# Patient Record
Sex: Female | Born: 2000 | Race: Black or African American | Hispanic: No | Marital: Single | Smoking: Light tobacco smoker
Health system: Southern US, Community
[De-identification: ages and names within clinical notes are randomized; demographics above are authoritative.]

## PROBLEM LIST (undated history)

## (undated) ENCOUNTER — Inpatient Hospital Stay (HOSPITAL_COMMUNITY): Payer: Self-pay

## (undated) DIAGNOSIS — R569 Unspecified convulsions: Secondary | ICD-10-CM

## (undated) DIAGNOSIS — A749 Chlamydial infection, unspecified: Secondary | ICD-10-CM

## (undated) DIAGNOSIS — F445 Conversion disorder with seizures or convulsions: Secondary | ICD-10-CM

## (undated) HISTORY — PX: NO PAST SURGERIES: SHX2092

## (undated) HISTORY — PX: WISDOM TOOTH EXTRACTION: SHX21

---
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
## 2019-09-14 ENCOUNTER — Emergency Department (HOSPITAL_COMMUNITY)
Admission: EM | Admit: 2019-09-14 | Discharge: 2019-09-14 | Disposition: A | Discharge status: Home / Self Care | Payer: Medicaid Other | Attending: Emergency Medicine | Admitting: Emergency Medicine

## 2019-09-14 ENCOUNTER — Emergency Department (HOSPITAL_COMMUNITY): Payer: Medicaid Other

## 2019-09-14 ENCOUNTER — Other Ambulatory Visit: Payer: Self-pay

## 2019-09-14 ENCOUNTER — Encounter (HOSPITAL_COMMUNITY): Payer: Self-pay | Admitting: Emergency Medicine

## 2019-09-14 DIAGNOSIS — M79644 Pain in right finger(s): Secondary | ICD-10-CM | POA: Insufficient documentation

## 2019-09-14 DIAGNOSIS — M546 Pain in thoracic spine: Secondary | ICD-10-CM | POA: Diagnosis not present

## 2019-09-14 MED ORDER — METHOCARBAMOL 500 MG PO TABS: 500.0000 mg | ORAL_TABLET | Freq: Two times a day (BID) | ORAL | 0 refills | Status: DC

## 2019-09-14 MED ORDER — NAPROXEN 500 MG PO TABS: 500.0000 mg | ORAL_TABLET | Freq: Two times a day (BID) | ORAL | 0 refills | Status: DC

## 2019-09-14 MED ORDER — ACETAMINOPHEN 325 MG PO TABS
650.0000 mg | ORAL_TABLET | Freq: Once | ORAL | Status: AC
  Administered 2019-09-14: 17:00:00 650 mg via ORAL
  Filled 2019-09-14: qty 2

## 2019-09-14 NOTE — ED Notes (Signed)
Pt verbalizes understanding of d/c instructions. Prescriptions reviewed with patient. Pt ambulatory at d/c with all belongings.  
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
## 2019-10-17 ENCOUNTER — Ambulatory Visit (INDEPENDENT_AMBULATORY_CARE_PROVIDER_SITE_OTHER): Payer: Medicaid Other | Admitting: Family Medicine

## 2019-10-17 ENCOUNTER — Other Ambulatory Visit: Payer: Self-pay

## 2019-10-17 ENCOUNTER — Ambulatory Visit: Payer: Medicaid Other | Admitting: Family Medicine

## 2019-10-17 ENCOUNTER — Encounter: Payer: Self-pay | Admitting: Family Medicine

## 2019-10-17 VITALS — BP 110/60 | HR 66 | Ht 64.0 in | Wt 174.8 lb

## 2019-10-17 DIAGNOSIS — Z3200 Encounter for pregnancy test, result unknown: Secondary | ICD-10-CM | POA: Diagnosis not present

## 2019-10-17 DIAGNOSIS — R5383 Other fatigue: Secondary | ICD-10-CM

## 2019-10-17 DIAGNOSIS — R42 Dizziness and giddiness: Secondary | ICD-10-CM

## 2019-10-17 LAB — POCT GLYCOSYLATED HEMOGLOBIN (HGB A1C): Hemoglobin A1C: 5.1 % (ref 4.0–5.6)

## 2019-10-17 LAB — POCT URINE PREGNANCY: Preg Test, Ur: NEGATIVE

## 2019-10-17 NOTE — Progress Notes (Signed)
    SUBJECTIVE:   CHIEF COMPLAINT / HPI:   Dizziness and Nausea Patient presents today due to concern for dizziness and nausea with some associated fatigue. She is concerned that she is pregnant. She describes the episodes as lasting a few seconds and then goes away on its own. It can feel like the room is spinning. It occurs mostly when she goes from sitting to standing. No fevers, chills, vomiting, abdominal pain, last period came at normal time, normal flow, and no pain. She has had no dysuria but has been drinking more fluids lately. She does endorse skipping meals at times.  PERTINENT  PMH / PSH: Hx of fatigue  OBJECTIVE:   BP 110/60   Pulse 66   Ht 5\' 4"  (1.626 m)   Wt 174 lb 12.8 oz (79.3 kg)   LMP 09/24/2019 (Exact Date)   SpO2 97%   Breastfeeding No   BMI 30.00 kg/m   Gen:  NAD CV: RRR, no murmurs, normal S1, S2 split Resp: CTAB, no wheezing, rales, or rhonchi, comfortable work of breathing Ext: no clubbing, cyanosis, or edema Skin: warm, dry, intact, no rashes  ASSESSMENT/PLAN:   Fatigue Unclear etiology. U Preg was negative, orthostatics taken and found to be normal. CBC and BMP obtained to check for anemia or dehydration and both were normal. A1c was normal so unlikely her symptoms are being caused by hyper or hypoglycemic events. TSH also normal so not related to thyroid dysfunction.  - Discussed with patient that she should not skip meals and to make sure she is drinking plenty of water and avoid sugary or overly caffienated beverages. - If symptoms persist or worse perhaps a more focused workup toward vertigo will be more revealing however I did not feel like her symptoms fit well with vertigo. - Overall, patient is pleased she is not pregnant. Follow up as needed.     11/24/2019, DO [place] [facility]

## 2019-10-17 NOTE — Patient Instructions (Signed)
It was great to see you today! Thank you for letting me participate in your care!  Today, we discussed your recent dizziness and found out you are not pregnant. However, I did get some additional blood work to help [place] determine the cause of the dizziness. I will call you if anything is abnormal. If you are still having symptoms after 10 days or it gets worse please return to the clinic. If your results are abnormal, I will bring you in sooner to discuss the next steps.  Be well, Jules Schick, DO PGY-3, [facility]

## 2019-10-18 ENCOUNTER — Encounter: Payer: Self-pay | Admitting: Family Medicine

## 2019-10-18 LAB — CBC
Hematocrit: 41.2 % (ref 34.0–46.6)
Hemoglobin: 13.5 g/dL (ref 11.1–15.9)
MCH: 28.1 pg (ref 26.6–33.0)
MCHC: 32.8 g/dL (ref 31.5–35.7)
MCV: 86 fL (ref 79–97)
Platelets: 331 10*3/uL (ref 150–450)
RBC: 4.81 x10E6/uL (ref 3.77–5.28)
RDW: 12.8 % (ref 11.7–15.4)
WBC: 7.3 10*3/uL (ref 3.4–10.8)

## 2019-10-18 LAB — BASIC METABOLIC PANEL
BUN/Creatinine Ratio: 14 (ref 9–23)
BUN: 12 mg/dL (ref 6–20)
CO2: 21 mmol/L (ref 20–29)
Calcium: 9.7 mg/dL (ref 8.7–10.2)
Chloride: 103 mmol/L (ref 96–106)
Creatinine, Ser: 0.83 mg/dL (ref 0.57–1.00)
GFR calc Af Amer: 117 mL/min/{1.73_m2} (ref >59)
GFR calc non Af Amer: 102 mL/min/{1.73_m2} (ref >59)
Glucose: 78 mg/dL (ref 65–99)
Potassium: 4.7 mmol/L (ref 3.5–5.2)
Sodium: 138 mmol/L (ref 134–144)

## 2019-10-18 LAB — TSH: TSH: 0.827 u[IU]/mL (ref 0.450–4.500)

## 2019-10-18 NOTE — Progress Notes (Signed)
Patient labs normal. Sending via MyChart

## 2019-10-19 NOTE — Assessment & Plan Note (Signed)
Unclear etiology. U Preg was negative, orthostatics taken and found to be normal. CBC and BMP obtained to check for anemia or dehydration and both were normal. A1c was normal so unlikely her symptoms are being caused by hyper or hypoglycemic events. TSH also normal so not related to thyroid dysfunction.  - Discussed with patient that she should not skip meals and to make sure she is drinking plenty of water and avoid sugary or overly caffienated beverages. - If symptoms persist or worse perhaps a more focused workup toward vertigo will be more revealing however I did not feel like her symptoms fit well with vertigo. - Overall, patient is pleased she is not pregnant. Follow up as needed.

## 2020-01-15 NOTE — Progress Notes (Deleted)
    SUBJECTIVE:   CHIEF COMPLAINT / HPI:   Exact LMP Regular** On any BCP ***  PERTINENT  PMH / PSH: ***  OBJECTIVE:   There were no vitals taken for this visit.   General: Alert and oriented, no apparent distress  Eyes: PEERLA ENTM: No pharyengeal erythema Neck: nontender Cardiovascular: RRR with no murmurs noted Respiratory: CTA bilaterally  Gastrointestinal: Bowel sounds present. No abdominal pain MSK: Upper extremity strength 5/5 bilaterally, Lower extremity strength 5/5 bilaterally  Derm: No rashes noted Neuro: ***  Psych: Behavior and speech appropriate to situation  ASSESSMENT/PLAN:   No problem-specific Assessment & Plan notes found for this encounter.     Dana Allan, MD [facility]

## 2020-01-16 ENCOUNTER — Ambulatory Visit: Payer: Medicaid Other

## 2020-01-16 ENCOUNTER — Emergency Department (HOSPITAL_COMMUNITY): Payer: Medicaid Other

## 2020-01-16 ENCOUNTER — Other Ambulatory Visit: Payer: Self-pay

## 2020-01-16 ENCOUNTER — Emergency Department (HOSPITAL_COMMUNITY)
Admission: EM | Admit: 2020-01-16 | Discharge: 2020-01-17 | Disposition: A | Discharge status: Home / Self Care | Payer: Medicaid Other | Attending: Emergency Medicine | Admitting: Emergency Medicine

## 2020-01-16 ENCOUNTER — Inpatient Hospital Stay (HOSPITAL_COMMUNITY)
Admission: AD | Admit: 2020-01-16 | Discharge: 2020-01-16 | Disposition: A | Discharge status: Home / Self Care | Payer: Medicaid Other | Attending: Emergency Medicine | Admitting: Emergency Medicine

## 2020-01-16 ENCOUNTER — Encounter (HOSPITAL_COMMUNITY): Payer: Self-pay | Admitting: Emergency Medicine

## 2020-01-16 ENCOUNTER — Telehealth: Payer: Self-pay

## 2020-01-16 DIAGNOSIS — Z20822 Contact with and (suspected) exposure to covid-19: Secondary | ICD-10-CM | POA: Diagnosis not present

## 2020-01-16 DIAGNOSIS — R509 Fever, unspecified: Secondary | ICD-10-CM | POA: Diagnosis present

## 2020-01-16 DIAGNOSIS — R079 Chest pain, unspecified: Secondary | ICD-10-CM | POA: Insufficient documentation

## 2020-01-16 DIAGNOSIS — R109 Unspecified abdominal pain: Secondary | ICD-10-CM

## 2020-01-16 DIAGNOSIS — R0789 Other chest pain: Secondary | ICD-10-CM | POA: Insufficient documentation

## 2020-01-16 DIAGNOSIS — O26891 Other specified pregnancy related conditions, first trimester: Secondary | ICD-10-CM

## 2020-01-16 DIAGNOSIS — Z3A00 Weeks of gestation of pregnancy not specified: Secondary | ICD-10-CM | POA: Diagnosis not present

## 2020-01-16 DIAGNOSIS — N3000 Acute cystitis without hematuria: Secondary | ICD-10-CM

## 2020-01-16 DIAGNOSIS — O4691 Antepartum hemorrhage, unspecified, first trimester: Secondary | ICD-10-CM | POA: Insufficient documentation

## 2020-01-16 DIAGNOSIS — R05 Cough: Secondary | ICD-10-CM | POA: Diagnosis not present

## 2020-01-16 DIAGNOSIS — R0602 Shortness of breath: Secondary | ICD-10-CM | POA: Diagnosis not present

## 2020-01-16 DIAGNOSIS — N3090 Cystitis, unspecified without hematuria: Secondary | ICD-10-CM | POA: Insufficient documentation

## 2020-01-16 DIAGNOSIS — R0981 Nasal congestion: Secondary | ICD-10-CM | POA: Diagnosis not present

## 2020-01-16 DIAGNOSIS — R1031 Right lower quadrant pain: Secondary | ICD-10-CM | POA: Insufficient documentation

## 2020-01-16 DIAGNOSIS — O4690 Antepartum hemorrhage, unspecified, unspecified trimester: Secondary | ICD-10-CM

## 2020-01-16 LAB — URINALYSIS, ROUTINE W REFLEX MICROSCOPIC
Bilirubin Urine: NEGATIVE
Glucose, UA: NEGATIVE mg/dL
Ketones, ur: 80 mg/dL — AB
Nitrite: NEGATIVE
Protein, ur: 100 mg/dL — AB
Specific Gravity, Urine: 1.032 — ABNORMAL HIGH (ref 1.005–1.030)
WBC, UA: >50 WBC/hpf — ABNORMAL HIGH (ref 0–5)
pH: 6 (ref 5.0–8.0)

## 2020-01-16 LAB — LIPASE, BLOOD: Lipase: 31 U/L (ref 11–51)

## 2020-01-16 LAB — WET PREP, GENITAL
Sperm: NONE SEEN
Trich, Wet Prep: NONE SEEN
Yeast Wet Prep HPF POC: NONE SEEN

## 2020-01-16 LAB — CBC
HCT: 41.4 % (ref 36.0–46.0)
Hemoglobin: 13.3 g/dL (ref 12.0–15.0)
MCH: 27.5 pg (ref 26.0–34.0)
MCHC: 32.1 g/dL (ref 30.0–36.0)
MCV: 85.7 fL (ref 80.0–100.0)
Platelets: 292 10*3/uL (ref 150–400)
RBC: 4.83 MIL/uL (ref 3.87–5.11)
RDW: 12.5 % (ref 11.5–15.5)
WBC: 4.4 10*3/uL (ref 4.0–10.5)
nRBC: 0 % (ref 0.0–0.2)

## 2020-01-16 LAB — COMPREHENSIVE METABOLIC PANEL
ALT: 17 U/L (ref 0–44)
AST: 18 U/L (ref 15–41)
Albumin: 3.9 g/dL (ref 3.5–5.0)
Alkaline Phosphatase: 66 U/L (ref 38–126)
Anion gap: 10 (ref 5–15)
BUN: 9 mg/dL (ref 6–20)
CO2: 20 mmol/L — ABNORMAL LOW (ref 22–32)
Calcium: 9.2 mg/dL (ref 8.9–10.3)
Chloride: 105 mmol/L (ref 98–111)
Creatinine, Ser: 0.73 mg/dL (ref 0.44–1.00)
GFR calc Af Amer: >60 mL/min (ref >60)
GFR calc non Af Amer: >60 mL/min (ref >60)
Glucose, Bld: 90 mg/dL (ref 70–99)
Potassium: 3.4 mmol/L — ABNORMAL LOW (ref 3.5–5.1)
Sodium: 135 mmol/L (ref 135–145)
Total Bilirubin: 0.7 mg/dL (ref 0.3–1.2)
Total Protein: 7.7 g/dL (ref 6.5–8.1)

## 2020-01-16 LAB — TROPONIN I (HIGH SENSITIVITY)
Troponin I (High Sensitivity): <2 ng/L (ref <18)
Troponin I (High Sensitivity): <2 ng/L (ref <18)

## 2020-01-16 LAB — SARS CORONAVIRUS 2 BY RT PCR (HOSPITAL ORDER, PERFORMED IN ~~LOC~~ HOSPITAL LAB): SARS Coronavirus 2: NEGATIVE

## 2020-01-16 LAB — I-STAT BETA HCG BLOOD, ED (MC, WL, AP ONLY): I-stat hCG, quantitative: >2000 m[IU]/mL — ABNORMAL HIGH (ref <5)

## 2020-01-16 LAB — HCG, QUANTITATIVE, PREGNANCY: hCG, Beta Chain, Quant, S: 9008 m[IU]/mL — ABNORMAL HIGH (ref <5)

## 2020-01-16 MED ORDER — SODIUM CHLORIDE 0.9 % IV BOLUS
1000.0000 mL | Freq: Once | INTRAVENOUS | Status: AC
  Administered 2020-01-16: 1000 mL via INTRAVENOUS

## 2020-01-16 MED ORDER — SODIUM CHLORIDE 0.9 % IV SOLN
2.0000 g | Freq: Once | INTRAVENOUS | Status: AC
  Administered 2020-01-16: 2 g via INTRAVENOUS
  Filled 2020-01-16: qty 20

## 2020-01-16 MED ORDER — ONDANSETRON HCL 4 MG/2ML IJ SOLN
4.0000 mg | Freq: Once | INTRAMUSCULAR | Status: AC
  Administered 2020-01-16: 4 mg via INTRAVENOUS
  Filled 2020-01-16: qty 2

## 2020-01-16 NOTE — ED Provider Notes (Signed)
Assumed care from PA Layden at shift change.  See prior notes for full H&P.  Briefly, 19 y.o. F who is newly pregnant here with urinary symptoms, nausea, vomiting, diarrhea, and reported fevers.  UA infectious.  Has spoken with Dr. Despina Hidden already-- requested covid screen, [place], 2g Rocephin for UTI.  Consult him back with results.  Plan:  covid screen and [place] pending.  IV Rocephin has been given.  Results for orders placed or performed during the hospital encounter of 01/16/20  SARS Coronavirus 2 by RT PCR (hospital order, performed in [facility] hospital lab) Nasopharyngeal Nasopharyngeal Swab   Specimen: Nasopharyngeal Swab  Result Value Ref Range   SARS Coronavirus 2 NEGATIVE NEGATIVE  Wet prep, genital  Result Value Ref Range   Yeast Wet Prep HPF POC NONE SEEN NONE SEEN   Trich, Wet Prep NONE SEEN NONE SEEN   Clue Cells Wet Prep HPF POC PRESENT (A) NONE SEEN   WBC, Wet Prep HPF POC MODERATE (A) NONE SEEN   Sperm NONE SEEN   Lipase, blood  Result Value Ref Range   Lipase 31 11 - 51 U/L  Comprehensive metabolic panel  Result Value Ref Range   Sodium 135 135 - 145 mmol/L   Potassium 3.4 (L) 3.5 - 5.1 mmol/L   Chloride 105 98 - 111 mmol/L   CO2 20 (L) 22 - 32 mmol/L   Glucose, Bld 90 70 - 99 mg/dL   BUN 9 6 - 20 mg/dL   Creatinine, Ser 0.25 0.44 - 1.00 mg/dL   Calcium 9.2 8.9 - 85.2 mg/dL   Total Protein 7.7 6.5 - 8.1 g/dL   Albumin 3.9 3.5 - 5.0 g/dL   AST 18 15 - 41 U/L   ALT 17 0 - 44 U/L   Alkaline Phosphatase 66 38 - 126 U/L   Total Bilirubin 0.7 0.3 - 1.2 mg/dL   GFR calc non Af Amer >60 >60 mL/min   GFR calc Af Amer >60 >60 mL/min   Anion gap 10 5 - 15  CBC  Result Value Ref Range   WBC 4.4 4.0 - 10.5 K/uL   RBC 4.83 3.87 - 5.11 MIL/uL   Hemoglobin 13.3 12.0 - 15.0 g/dL   HCT 77.8 36 - 46 %   MCV 85.7 80.0 - 100.0 fL   MCH 27.5 26.0 - 34.0 pg   MCHC 32.1 30.0 - 36.0 g/dL   RDW 24.2 35.3 - 61.4 %   Platelets 292 150 - 400 K/uL   nRBC 0.0 0.0 - 0.2 %  Urinalysis,  Routine w reflex microscopic Urine, Clean Catch  Result Value Ref Range   Color, Urine AMBER (A) YELLOW   APPearance CLOUDY (A) CLEAR   Specific Gravity, Urine 1.032 (H) 1.005 - 1.030   pH 6.0 5.0 - 8.0   Glucose, UA NEGATIVE NEGATIVE mg/dL   Hgb urine dipstick MODERATE (A) NEGATIVE   Bilirubin Urine NEGATIVE NEGATIVE   Ketones, ur 80 (A) NEGATIVE mg/dL   Protein, ur 431 (A) NEGATIVE mg/dL   Nitrite NEGATIVE NEGATIVE   Leukocytes,Ua LARGE (A) NEGATIVE   RBC / HPF 21-50 0 - 5 RBC/hpf   WBC, UA >50 (H) 0 - 5 WBC/hpf   Bacteria, UA FEW (A) NONE SEEN   Squamous Epithelial / LPF 11-20 0 - 5   Mucus PRESENT    Non Squamous Epithelial 0-5 (A) NONE SEEN  hCG, quantitative, pregnancy  Result Value Ref Range   hCG, Beta Chain, Quant, S 9,008 (H) <5  mIU/mL  I-Stat beta hCG blood, ED  Result Value Ref Range   I-stat hCG, quantitative >2,000.0 (H) <5 mIU/mL   Comment 3          ABO/Rh  Result Value Ref Range   ABO/RH(D) B POS    No rh immune globuloin      NOT A RH IMMUNE GLOBULIN CANDIDATE, PT RH POSITIVE Performed at [facility], [address], [place], [place] [address]   Troponin I (High Sensitivity)  Result Value Ref Range   Troponin I (High Sensitivity) <2 <18 ng/L  Troponin I (High Sensitivity)  Result Value Ref Range   Troponin I (High Sensitivity) <2 <18 ng/L   DG Chest 1 View  Result Date: 01/16/2020 CLINICAL DATA:  Chest pain EXAM: CHEST  1 VIEW COMPARISON:  None. FINDINGS: The heart size and mediastinal contours are within normal limits. Both lungs are clear. The visualized skeletal structures are unremarkable. IMPRESSION: No active disease. Electronically Signed   By: Paulina Fusi M.D.   On: 01/16/2020 21:48   US OB Comp < 14 Wks  Result Date: 01/16/2020 CLINICAL DATA:  Vaginal bleeding. EXAM: OBSTETRIC <14 WK ULTRASOUND TECHNIQUE: Transabdominal ultrasound was performed for evaluation of the gestation as well as the maternal uterus and adnexal regions. COMPARISON:   None. FINDINGS: Intrauterine gestational sac: Single Yolk sac:  Not Visualized. Embryo:  Not Visualized. Cardiac Activity: Not Visualized. Heart Rate: N/A bpm MSD:  8 mm   5 w   4 d Subchorionic hemorrhage:  A small subchorionic hemorrhage is noted. Maternal uterus/adnexae: Both ovaries are visualized and are normal in appearance. A moderate amount of pelvic free fluid is seen. IMPRESSION: Single intrauterine gestational sac (at approximately 5 weeks and 4 days gestation by ultrasound evaluation) without evidence of a yolk sac or fetal pole. While this may be secondary to early intrauterine pregnancy, correlation with follow-up pelvic ultrasound is recommended. Electronically Signed   By: Aram Candela M.D.   On: 01/16/2020 23:51     11:57 PM Spoke with Dr. Despina Hidden-- recommends to d/c home with keflex TID x10 days, FU in clinic in 10 days for repeat US.  12:05 AM Patient reassessed and--- feeling better after zofran.  IV abx and IVF infusing.  She has been updated with results thus far. Will PO challenge and likely discharge after infusions are complete.  2:09 AM Attempting ginger ale and crackers right now.  Will reassess.  2:50 AM Tolerating fluids well, no vomiting.  Stable for discharge home.  Will follow-up with [facility] in 10 days for repeat US.  Rx keflex as above, good oral hydration.  Return here for any new/acute changes.   Garlon Hatchet, PA-C 01/17/20 0253    Marily Memos, MD 01/17/20 [phone]

## 2020-01-16 NOTE — ED Notes (Signed)
Patient currently at ultrasound .  

## 2020-01-16 NOTE — ED Triage Notes (Signed)
Pt reports fever, n/v/d runny nose and cough for the past few days. Son was recently sick as well but did not see a doctor, also reports positive at home pregnancy test and endorses light vaginal spotting.

## 2020-01-16 NOTE — ED Provider Notes (Signed)
[facility] EMERGENCY DEPARTMENT Provider Note   CSN: [id number] Arrival date & time: 01/16/20  1033     History Chief Complaint  Patient presents with  . Fever  . Emesis  . Diarrhea    Kristin Hunt is a 19 y.o. female with PMH/o pseudoseizures who presents for evaluation of multiple complaints.  Patient reports that she was initially going to the family medicine clinic today for confirmation of pregnancy.  She reports that about 6 days ago, she took a at home pregnancy test that was positive.  She states that she was going to family clinic this morning to get confirmation but states that she would had been having a few days of cough, nasal congestion, subjective fever/chills, fatigue.  They sent her to the emergency department since that she was having other respiratory symptoms.  She said for the last 3 days, she has had subjective fever chills.  She states she has had cough that is productive of phlegm.  She has had some nasal congestion.  She also reports she has had some nausea/vomiting.  She also reports that over the last few days, she has had some lower abdominal pain.  She describes it as a constant cramping type pain.  She also noticed about 2 to 3 days ago, every time she wiped after urination, she saw blood on the toilet paper.  She states that today she felt like the bleeding was getting little bit worse and was happening even when she was not urinating.  She states that she is only used 1 pad today.  While in the waiting room, she states that she started developing some chest pain, difficulty breathing.  She states that she did not get Covid vaccinated.  She has had some exposure to COVID-19 at work.  She has previous pregnancy with no prior complications.  She has not sought OB/GYN evaluation for this pregnancy yet.  The history is provided by the patient.       Past Medical History:  Diagnosis Date  . Chlamydia   . Pseudoseizures   . Pseudoseizures      Patient Active Problem List   Diagnosis Date Noted  . Fatigue 10/17/2019  . Dizziness 10/17/2019  . Possible pregnancy 03/27/2018  . Pseudoseizure   . Body mass index (BMI) of 85th to less than 95th percentile in overweight pediatric patient 04/08/2017  . Foster care (status) 08/09/2016    Past Surgical History:  Procedure Laterality Date  . NO PAST SURGERIES    . WISDOM TOOTH EXTRACTION     2018     OB History    Gravida  1   Para  1   Term  1   Preterm  0   AB  0   Living  1     SAB  0   TAB  0   Ectopic  0   Multiple  0   Live Births  1           Family History  Problem Relation Age of Onset  . Diabetes Paternal Grandmother   . Lung cancer Paternal Grandmother     Social History   Tobacco Use  . Smoking status: Never Smoker  . Smokeless tobacco: Never Used  Substance Use Topics  . Alcohol use: No  . Drug use: No    Home Medications Prior to Admission medications   Medication Sig Start Date End Date Taking? Authorizing Provider  ibuprofen (ADVIL,MOTRIN) 600 MG tablet Take 1  tablet (600 mg total) by mouth every 6 (six) hours. Patient not taking: Reported on 01/16/2020 01/06/18   Cresenzo-Dishmon, Scarlette CalicoFrances, CNM  methocarbamol (ROBAXIN) 500 MG tablet Take 1 tablet (500 mg total) by mouth 2 (two) times daily. Patient not taking: Reported on 01/16/2020 09/14/19   Henderly, Britni A, PA-C  naproxen (NAPROSYN) 500 MG tablet Take 1 tablet (500 mg total) by mouth 2 (two) times daily. Patient not taking: Reported on 01/16/2020 09/14/19   Henderly, Britni A, PA-C    Allergies    Patient has no known allergies.  Review of Systems   Review of Systems  Constitutional: Positive for fever.  HENT: Positive for congestion.   Respiratory: Positive for cough and shortness of breath.   Cardiovascular: Positive for chest pain.  Gastrointestinal: Positive for abdominal pain. Negative for nausea and vomiting.  Genitourinary: Positive for vaginal bleeding.  Negative for dysuria and hematuria.  Neurological: Negative for headaches.  All other systems reviewed and are negative.   Physical Exam Updated Vital Signs BP 94/74   Pulse 94   Temp 99 F (37.2 C) (Oral)   Resp 14   SpO2 100%   Physical Exam Vitals and nursing note reviewed. Exam conducted with a chaperone present.  Constitutional:      Appearance: Normal appearance. She is well-developed.     Comments: Appears uncomfortable but NAD  HENT:     Head: Normocephalic and atraumatic.  Eyes:     General: Lids are normal.     Conjunctiva/sclera: Conjunctivae normal.     Pupils: Pupils are equal, round, and reactive to light.  Cardiovascular:     Rate and Rhythm: Normal rate and regular rhythm.     Pulses: Normal pulses.     Heart sounds: Normal heart sounds. No murmur heard.  No friction rub. No gallop.   Pulmonary:     Effort: Pulmonary effort is normal.     Breath sounds: Normal breath sounds.     Comments: Lungs clear to auscultation bilaterally.  Symmetric chest rise.  No wheezing, rales, rhonchi. Abdominal:     Palpations: Abdomen is soft. Abdomen is not rigid.     Tenderness: There is abdominal tenderness in the right lower quadrant and suprapubic area. There is no right CVA tenderness, left CVA tenderness or guarding.     Comments: Abdomen is soft, nondistended.  Tenderness palpation of suprapubic region.  No rigidity, guarding.  She also has some mild tenderness in the right lower quadrant.  No rigidity, guarding. No CVA tenderness noted bilaterally.   Genitourinary:    Comments: The exam was performed with a chaperone present. Normal external female genitalia. No lesions, rash, or sores.  Bleeding noted in vaginal vault that appears to be coming from cervix.  Limited evaluation of the cervical os secondary to bleeding.  She also has a moderate amount of discharge noted.  No CMT but does have some right adnexal tenderness.  No mass palpated.  No left adnexal mass or  tenderness noted. Musculoskeletal:        General: Normal range of motion.     Cervical back: Full passive range of motion without pain.  Skin:    General: Skin is warm and dry.     Capillary Refill: Capillary refill takes less than 2 seconds.  Neurological:     Mental Status: She is alert and oriented to person, place, and time.  Psychiatric:        Speech: Speech normal.     ED Results /  Procedures / Treatments   Labs (all labs ordered are listed, but only abnormal results are displayed) Labs Reviewed  WET PREP, GENITAL - Abnormal; Notable for the following components:      Result Value   Clue Cells Wet Prep HPF POC PRESENT (*)    WBC, Wet Prep HPF POC MODERATE (*)    All other components within normal limits  COMPREHENSIVE METABOLIC PANEL - Abnormal; Notable for the following components:   Potassium 3.4 (*)    CO2 20 (*)    All other components within normal limits  URINALYSIS, ROUTINE W REFLEX MICROSCOPIC - Abnormal; Notable for the following components:   Color, Urine AMBER (*)    APPearance CLOUDY (*)    Specific Gravity, Urine 1.032 (*)    Hgb urine dipstick MODERATE (*)    Ketones, ur 80 (*)    Protein, ur 100 (*)    Leukocytes,Ua LARGE (*)    WBC, UA >50 (*)    Bacteria, UA FEW (*)    Non Squamous Epithelial 0-5 (*)    All other components within normal limits  HCG, QUANTITATIVE, PREGNANCY - Abnormal; Notable for the following components:   hCG, Beta Chain, Quant, S 9,008 (*)    All other components within normal limits  I-STAT BETA HCG BLOOD, ED ([facility], [facility], [facility] ONLY) - Abnormal; Notable for the following components:   I-stat hCG, quantitative >2,000.0 (*)    All other components within normal limits  SARS CORONAVIRUS 2 BY RT PCR (HOSPITAL ORDER, PERFORMED IN [facility])  URINE CULTURE  LIPASE, BLOOD  CBC  ABO/RH  GC/CHLAMYDIA PROBE AMP ([place]) NOT AT [facility]  TROPONIN I (HIGH SENSITIVITY)  TROPONIN I (HIGH SENSITIVITY)     EKG None  Radiology DG Chest 1 View  Result Date: 01/16/2020 CLINICAL DATA:  Chest pain EXAM: CHEST  1 VIEW COMPARISON:  None. FINDINGS: The heart size and mediastinal contours are within normal limits. Both lungs are clear. The visualized skeletal structures are unremarkable. IMPRESSION: No active disease. Electronically Signed   By: Paulina Fusi M.D.   On: 01/16/2020 21:48    Procedures Procedures (including critical care time)  Medications Ordered in ED Medications  cefTRIAXone (ROCEPHIN) 2 g in sodium chloride 0.9 % 100 mL IVPB (has no administration in time range)  sodium chloride 0.9 % bolus 1,000 mL (has no administration in time range)  ondansetron (ZOFRAN) injection 4 mg (4 mg Intravenous Given 01/16/20 2159)    ED Course  I have reviewed the triage vital signs and the nursing notes.  Pertinent labs & imaging results that were available during my care of the patient were reviewed by me and considered in my medical decision making (see chart for details).    MDM Rules/Calculators/A&P                           19 year old female presents for evaluation of pregnancy, abdominal pain, vaginal bleeding as well as fever, cough, congestion as well as chest pain and shortness of breath.  She had a previous at home pregnancy test that was positive and was going to follow-up this morning but was told to come to emergency department because she has had some fever, cough, chills.  She also reports she has had some chest pain and difficulty breathing that started while she was out in the waiting room.  She is G1, P1.  She states her LMP was at the beginning of  July.  On initial arrival, she is low grade fever.  She appears uncomfortable but nontoxic.  On exam, she does have some suprapubic and right lower quadrant abdominal pain.  Mostly on exam, her tenderness is in the suprapubic region.  The right lower quadrant abdominal pain is very minimal.  At this time, she has no focal tenderness  noted at McBurney's point.  I do not suspect that this is appendicitis given that she has had cough, congestion as well.  Lungs clear to auscultation.  Concern for infectious etiology such as COVID-19.  Also consider viral URI.  Also consider ectopic pregnancy versus miscarriage.  Labs ordered at triage.  I-STAT beta is positive greater than 2000.  CMP shows potassium of 3.4.  Normal BUN and creatinine.  Lipase normal.  CBC shows no leukocytosis or anemia.  UA is concerning for infectious etiology with leukocytes, pyuria, bacteria.  Kristin Hunt is negative.  Chest x-ray negative for any infectious etiology.  Pelvic exam as documented above.  Patient with bleeding in vaginal vault that appears to be coming from cervix.  Limited evaluation of the cervical os.  Patient also with large amount of discharge noted.  No CMT.  She does have some right adnexal tenderness.  No mass.  No left adnexal tenderness.  I discussed with Dr. Despina Hidden (OB/GYN).  He recommends obtaining Covid, beta quant.  Recommends ultrasound and can be consulted after Covid and quant are back.  He recommends giving 2 g of IV Rocephin for UTI.  Patient signed out to Sharilyn Sites, PA-C pending U/S Covid test and re-evaluation.   Kristin Hunt was evaluated in Emergency Department on 01/16/2020 for the symptoms described in the history of present illness. She was evaluated in the context of the global COVID-19 pandemic, which necessitated consideration that the patient might be at risk for infection with the SARS-CoV-2 virus that causes COVID-19. Institutional protocols and algorithms that pertain to the evaluation of patients at risk for COVID-19 are in a state of rapid change based on information released by regulatory bodies including the CDC and federal and state organizations. These policies and algorithms were followed during the patient's care in the ED.  Portions of this note were generated with Scientist, clinical (histocompatibility and immunogenetics). Dictation errors may  occur despite best attempts at proofreading.   Final Clinical Impression(s) / ED Diagnoses Final diagnoses:  Abdominal pain during pregnancy in first trimester  Vaginal bleeding in pregnancy  Acute cystitis without hematuria    Rx / DC Orders ED Discharge Orders    None       Rosana Hoes 01/16/20 2339    Milagros Loll, MD 01/17/20 0004

## 2020-01-16 NOTE — ED Notes (Addendum)
Pt brought to this RN from waiting room. Pt c/o SOB and CP w/ breathing. Pt reports she has felt SOB since being here but CP just began. Vitals reassessed. New orders placed based on new complaints

## 2020-01-16 NOTE — Telephone Encounter (Signed)
Noted and agreed thank you 

## 2020-01-16 NOTE — Telephone Encounter (Signed)
Patient reports to clinic to confirm pregnancy. At check-in, patient reports cough, fever, chills, headache, weakness and runny nose. Patient was advised by front desk staff to return to car for further instruction. RN calls patient at provided number, verified with name and DOB. Dr. Clent Ridges present during conversation. Patient reports positive home pregnancy test. Patient reports abdominal pain starting two days ago and vaginal bleeding. Due to these symptoms, patient was advised by Dr. Clent Ridges and Dr. Manson Passey (preceptor) to be evaluated in [facility].   Patient was agreeable to plan and will report to [facility]U for evaluation.   Veronda Prude, RN

## 2020-01-17 LAB — ABO/RH: ABO/RH(D): B POS

## 2020-01-17 MED ORDER — CEPHALEXIN 500 MG PO CAPS
500.0000 mg | ORAL_CAPSULE | Freq: Three times a day (TID) | ORAL | 0 refills | Status: DC
Start: 2020-01-17 — End: 2020-02-21

## 2020-01-17 NOTE — Discharge Instructions (Signed)
Take the prescribed medication as directed.  Make sure to finish all of the medication. Try to make sure to stay hydrated. Follow-up with women's clinic in 10 days for repeat ultrasound. Return to the ED for new or worsening symptoms.

## 2020-01-18 LAB — GC/CHLAMYDIA PROBE AMP (~~LOC~~) NOT AT ARMC
Chlamydia: NEGATIVE
Comment: NEGATIVE
Comment: NORMAL
Neisseria Gonorrhea: POSITIVE — AB

## 2020-02-21 ENCOUNTER — Encounter (HOSPITAL_COMMUNITY): Payer: Self-pay | Admitting: Obstetrics & Gynecology

## 2020-02-21 ENCOUNTER — Inpatient Hospital Stay (HOSPITAL_COMMUNITY): Payer: Medicaid Other

## 2020-02-21 ENCOUNTER — Other Ambulatory Visit: Payer: Self-pay

## 2020-02-21 ENCOUNTER — Inpatient Hospital Stay (HOSPITAL_COMMUNITY)
Admission: AD | Admit: 2020-02-21 | Discharge: 2020-02-21 | Disposition: A | Discharge status: Home / Self Care | Payer: Medicaid Other | Attending: Obstetrics & Gynecology | Admitting: Obstetrics & Gynecology

## 2020-02-21 DIAGNOSIS — Z79899 Other long term (current) drug therapy: Secondary | ICD-10-CM | POA: Diagnosis not present

## 2020-02-21 DIAGNOSIS — R109 Unspecified abdominal pain: Secondary | ICD-10-CM | POA: Diagnosis not present

## 2020-02-21 DIAGNOSIS — Z3A09 9 weeks gestation of pregnancy: Secondary | ICD-10-CM | POA: Diagnosis not present

## 2020-02-21 DIAGNOSIS — O26891 Other specified pregnancy related conditions, first trimester: Secondary | ICD-10-CM | POA: Insufficient documentation

## 2020-02-21 DIAGNOSIS — R1013 Epigastric pain: Secondary | ICD-10-CM | POA: Diagnosis not present

## 2020-02-21 DIAGNOSIS — Z87440 Personal history of urinary (tract) infections: Secondary | ICD-10-CM | POA: Diagnosis not present

## 2020-02-21 DIAGNOSIS — O219 Vomiting of pregnancy, unspecified: Secondary | ICD-10-CM | POA: Diagnosis not present

## 2020-02-21 DIAGNOSIS — Z791 Long term (current) use of non-steroidal anti-inflammatories (NSAID): Secondary | ICD-10-CM | POA: Insufficient documentation

## 2020-02-21 LAB — COMPREHENSIVE METABOLIC PANEL
ALT: 26 U/L (ref 0–44)
AST: 25 U/L (ref 15–41)
Albumin: 3.8 g/dL (ref 3.5–5.0)
Alkaline Phosphatase: 48 U/L (ref 38–126)
Anion gap: 16 — ABNORMAL HIGH (ref 5–15)
BUN: 7 mg/dL (ref 6–20)
CO2: 15 mmol/L — ABNORMAL LOW (ref 22–32)
Calcium: 9.8 mg/dL (ref 8.9–10.3)
Chloride: 103 mmol/L (ref 98–111)
Creatinine, Ser: 0.76 mg/dL (ref 0.44–1.00)
GFR calc Af Amer: >60 mL/min (ref >60)
GFR calc non Af Amer: >60 mL/min (ref >60)
Glucose, Bld: 83 mg/dL (ref 70–99)
Potassium: 3 mmol/L — ABNORMAL LOW (ref 3.5–5.1)
Sodium: 134 mmol/L — ABNORMAL LOW (ref 135–145)
Total Bilirubin: 0.7 mg/dL (ref 0.3–1.2)
Total Protein: 7.7 g/dL (ref 6.5–8.1)

## 2020-02-21 LAB — CBC WITH DIFFERENTIAL/PLATELET
Abs Immature Granulocytes: 0.01 10*3/uL (ref 0.00–0.07)
Basophils Absolute: 0 10*3/uL (ref 0.0–0.1)
Basophils Relative: 0 %
Eosinophils Absolute: 0 10*3/uL (ref 0.0–0.5)
Eosinophils Relative: 0 %
HCT: 41.2 % (ref 36.0–46.0)
Hemoglobin: 13.9 g/dL (ref 12.0–15.0)
Immature Granulocytes: 0 %
Lymphocytes Relative: 19 %
Lymphs Abs: 0.9 10*3/uL (ref 0.7–4.0)
MCH: 27.9 pg (ref 26.0–34.0)
MCHC: 33.7 g/dL (ref 30.0–36.0)
MCV: 82.6 fL (ref 80.0–100.0)
Monocytes Absolute: 0.6 10*3/uL (ref 0.1–1.0)
Monocytes Relative: 12 %
Neutro Abs: 3.3 10*3/uL (ref 1.7–7.7)
Neutrophils Relative %: 69 %
Platelets: 283 10*3/uL (ref 150–400)
RBC: 4.99 MIL/uL (ref 3.87–5.11)
RDW: 12.6 % (ref 11.5–15.5)
WBC: 4.7 10*3/uL (ref 4.0–10.5)
nRBC: 0 % (ref 0.0–0.2)

## 2020-02-21 LAB — URINALYSIS, ROUTINE W REFLEX MICROSCOPIC
Bilirubin Urine: NEGATIVE
Glucose, UA: NEGATIVE mg/dL
Hgb urine dipstick: NEGATIVE
Ketones, ur: 80 mg/dL — AB
Nitrite: NEGATIVE
Protein, ur: 100 mg/dL — AB
Specific Gravity, Urine: 1.032 — ABNORMAL HIGH (ref 1.005–1.030)
WBC, UA: >50 WBC/hpf — ABNORMAL HIGH (ref 0–5)
pH: 6 (ref 5.0–8.0)

## 2020-02-21 LAB — LIPASE, BLOOD: Lipase: 48 U/L (ref 11–51)

## 2020-02-21 MED ORDER — LACTATED RINGERS IV SOLN: INTRAVENOUS | Status: DC

## 2020-02-21 MED ORDER — FENTANYL CITRATE (PF) 100 MCG/2ML IJ SOLN
50.0000 ug | Freq: Once | INTRAMUSCULAR | Status: AC
  Administered 2020-02-21: 50 ug via INTRAVENOUS
  Filled 2020-02-21: qty 2

## 2020-02-21 MED ORDER — ONDANSETRON 4 MG PO TBDP: 4.0000 mg | ORAL_TABLET | Freq: Three times a day (TID) | ORAL | 2 refills | Status: DC | PRN

## 2020-02-21 MED ORDER — FAMOTIDINE IN NACL 20-0.9 MG/50ML-% IV SOLN
20.0000 mg | Freq: Once | INTRAVENOUS | Status: AC
  Administered 2020-02-21: 20 mg via INTRAVENOUS
  Filled 2020-02-21: qty 50

## 2020-02-21 MED ORDER — ONDANSETRON HCL 4 MG/2ML IJ SOLN
4.0000 mg | Freq: Once | INTRAMUSCULAR | Status: AC
  Administered 2020-02-21: 4 mg via INTRAVENOUS
  Filled 2020-02-21: qty 2

## 2020-02-21 MED ORDER — LACTATED RINGERS IV BOLUS
1000.0000 mL | Freq: Once | INTRAVENOUS | Status: AC
  Administered 2020-02-21: 1000 mL via INTRAVENOUS

## 2020-02-21 NOTE — MAU Provider Note (Signed)
History     CSN: [id number]  Arrival date and time: 02/21/20 0254   First Provider Initiated Contact with Patient 02/21/20 0109      Chief Complaint  Patient presents with  . Emesis During Pregnancy  . Nausea  . Abdominal Pain   Kristin Hunt is a 19 y.o. G2P1 at [redacted]w[redacted]d who presents to MAU with complaints of abdominal pain and emesis. Patient reports symptoms started occurring 3 days ago. Patient reports sharp stabbing pain that is constant in RLQ. Rates pain 10/10- has not taken any medication for pain. Patient reports that nausea and emesis started around the same time as the abdominal pain. She reports that she has been unable to eat or keep anything down. She denies vaginal bleeding or vaginal discharge. Intrauterine gestational sac was seen on [place] on 8/4- no concern for ectopic pregnancy at this time.    OB History    Gravida  2   Para  1   Term  1   Preterm  0   AB  0   Living  1     SAB  0   TAB  0   Ectopic  0   Multiple  0   Live Births  1           Past Medical History:  Diagnosis Date  . Chlamydia   . Pseudoseizures   . Pseudoseizures     Past Surgical History:  Procedure Laterality Date  . NO PAST SURGERIES    . WISDOM TOOTH EXTRACTION     2018    Family History  Problem Relation Age of Onset  . Diabetes Paternal Grandmother   . Lung cancer Paternal Grandmother     Social History   Tobacco Use  . Smoking status: Never Smoker  . Smokeless tobacco: Never Used  Substance Use Topics  . Alcohol use: No  . Drug use: No    Allergies: No Known Allergies  Medications Prior to Admission  Medication Sig Dispense Refill Last Dose  . cephALEXin (KEFLEX) 500 MG capsule Take 1 capsule (500 mg total) by mouth 3 (three) times daily. 21 capsule 0   . ibuprofen (ADVIL,MOTRIN) 600 MG tablet Take 1 tablet (600 mg total) by mouth every 6 (six) hours. (Patient not taking: Reported on 01/16/2020) 30 tablet 0   . methocarbamol (ROBAXIN) 500 MG tablet  Take 1 tablet (500 mg total) by mouth 2 (two) times daily. (Patient not taking: Reported on 01/16/2020) 20 tablet 0   . naproxen (NAPROSYN) 500 MG tablet Take 1 tablet (500 mg total) by mouth 2 (two) times daily. (Patient not taking: Reported on 01/16/2020) 30 tablet 0     Review of Systems  Constitutional: Negative.   Respiratory: Negative.   Cardiovascular: Negative.   Gastrointestinal: Positive for abdominal pain, nausea and vomiting. Negative for constipation and diarrhea.  Genitourinary: Negative.   Musculoskeletal: Negative.   Neurological: Negative.   Psychiatric/Behavioral: Negative.    Physical Exam   Blood pressure 120/83, pulse 75, temperature (!) 97.5 F (36.4 C), temperature source Oral, resp. rate 18, height 5\' 3"  (1.6 m), weight 66 kg, last menstrual period 12/17/2019, SpO2 100 %.  Physical Exam Vitals and nursing note reviewed.  Constitutional:      General: She is in acute distress.     Comments: Patient is tearful in room d/t pain   HENT:     Head: Normocephalic.  Cardiovascular:     Rate and Rhythm: Normal rate and regular rhythm.  Pulmonary:     Effort: Pulmonary effort is normal. No respiratory distress.     Breath sounds: Normal breath sounds. No wheezing.  Abdominal:     General: There is no distension.     Palpations: Abdomen is soft. There is no mass.     Tenderness: There is abdominal tenderness in the right lower quadrant and epigastric area. There is guarding. There is no right CVA tenderness or left CVA tenderness. Positive signs include McBurney's sign.  Musculoskeletal:     Right lower leg: No edema.     Left lower leg: No edema.  Skin:    General: Skin is warm and dry.  Neurological:     Mental Status: She is alert and oriented to person, place, and time.  Psychiatric:        Mood and Affect: Mood normal.        Behavior: Behavior normal.        Thought Content: Thought content normal.    FHR 169 by doppler   MAU Course   Procedures  MDM  Orders Placed This Encounter  Procedures  . Culture, OB Urine  . US APPENDIX (ABDOMEN LIMITE[place])  . US PELVIC DOPPLER (TORSION R/O OR MASS ARTERIAL FLOW)  . MR PELVIS WO CONTRAST  . MR ABDOMEN WO CONTRAST  . Urinalysis, Routine w reflex microscopic Urine, Clean Catch  . CBC with Differential/Platelet  . Lipase, blood  . Comprehensive metabolic panel  . Insert peripheral IV   Meds ordered this encounter  Medications  . lactated ringers bolus 1,000 mL  . fentaNYL (SUBLIMAZE) injection 50 mcg  . famotidine (PEPCID) IVPB 20 mg premix  . ondansetron (ZOFRAN) injection 4 mg  . lactated ringers infusion  . ondansetron (ZOFRAN) injection 4 mg   Treatments in MAU included IV bolus for hydration (80 ketones in urine), fentanyl 50mcg for pain, pepcid IV epigastric pain, and zofran 4mg  IV for emesis.   According to up to date- US to r/o appendicitis first line prior to MRI in early pregnant patient- US ordered   US report reviewed:  US PELVIC DOPPLER (TORSION R/O OR MASS ARTERIAL FLOW)  Result Date: 02/21/2020 CLINICAL DATA:  Initial evaluation for acute pelvic pain, evaluate for torsion. Early pregnancy. EXAM: TRANSABDOMINAL AND TRANSVAGINAL ULTRASOUND OF PELVIS DOPPLER ULTRASOUND OF OVARIES TECHNIQUE: Both transabdominal and transvaginal ultrasound examinations of the pelvis were performed. Transabdominal technique was performed for global imaging of the pelvis including uterus, ovaries, adnexal regions, and pelvic cul-de-sac. It was necessary to proceed with endovaginal exam following the transabdominal exam to visualize the ovaries. Color and duplex Doppler ultrasound was utilized to evaluate blood flow to the ovaries. COMPARISON:  Prior ultrasound from 01/16/2020 FINDINGS: Uterus Measurements: 11.0 x 7.4 x 9.0 cm = volume: 382.8 mL. No fibroids or other mass visualized. Endometrium Single IUP present within the endometrial cavity. Embryo or viability is not assessed on this  exam. Right ovary Measurements: 4.8 x 1.4 x 2.6 cm = volume: 9.4 mL. Normal appearance/no adnexal mass. Left ovary Measurements: 2.8 x 1.4 x 1.5 cm = volume: 3.0 mL. Normal appearance/no adnexal mass. Pulsed Doppler evaluation of both ovaries demonstrates normal low-resistance arterial and venous waveforms. Other findings No abnormal free fluid. IMPRESSION: 1. Normal sonographic evaluation of the ovaries. No evidence for torsion or other acute abnormality. 2. Gravid uterus containing single IUP. Please note that the embryo and/or viability was not assessed on this exam. Electronically Signed   By: Rise MuBenjamin  McClintock M.D.   On: 02/21/2020  02:59   US APPENDIX (ABDOMEN LIMITED)  Result Date: 02/21/2020 CLINICAL DATA:  Initial evaluation for acute abdominal pain, early pregnancy. EXAM: ULTRASOUND ABDOMEN LIMITED TECHNIQUE: Wallace Cullens scale imaging of the right lower quadrant was performed to evaluate for suspected appendicitis. Standard imaging planes and graded compression technique were utilized. COMPARISON:  Prior ultrasound from 04/28/2017. FINDINGS: The appendix is not visualized. Ancillary findings: None. Factors affecting image quality: None. Other findings: None. IMPRESSION: Nonvisualization of the appendix. No other acute abnormality identified. Please note that a nonvisualized appendix does not exclude acute appendicitis. If there is continued high clinical suspicion for possible acute appendicitis, then further evaluation with dedicated cross-sectional imaging would be recommended for further evaluation. In this case, MRI of the abdomen and pelvis without contrast would be recommended given the patient's current pregnancy status. Electronically Signed   By: Rise Mu M.D.   On: 02/21/2020 02:43   [place] unable to assess appendix will order reassess patient pain in RLQ and order MRI if pain continues to be excruciating.   Labs reviewed  Results for orders placed or performed during the hospital  encounter of 02/21/20 (from the past 24 hour(s))  CBC with Differential/Platelet     Status: None   Collection Time: 02/21/20  1:23 AM  Result Value Ref Range   WBC 4.7 4.0 - 10.5 K/uL   RBC 4.99 3.87 - 5.11 MIL/uL   Hemoglobin 13.9 12.0 - 15.0 g/dL   HCT 63.8 36 - 46 %   MCV 82.6 80.0 - 100.0 fL   MCH 27.9 26.0 - 34.0 pg   MCHC 33.7 30.0 - 36.0 g/dL   RDW 93.7 34.2 - 87.6 %   Platelets 283 150 - 400 K/uL   nRBC 0.0 0.0 - 0.2 %   Neutrophils Relative % 69 %   Neutro Abs 3.3 1.7 - 7.7 K/uL   Lymphocytes Relative 19 %   Lymphs Abs 0.9 0.7 - 4.0 K/uL   Monocytes Relative 12 %   Monocytes Absolute 0.6 0 - 1 K/uL   Eosinophils Relative 0 %   Eosinophils Absolute 0.0 0 - 0 K/uL   Basophils Relative 0 %   Basophils Absolute 0.0 0 - 0 K/uL   Immature Granulocytes 0 %   Abs Immature Granulocytes 0.01 0.00 - 0.07 K/uL  Lipase, blood     Status: None   Collection Time: 02/21/20  1:23 AM  Result Value Ref Range   Lipase 48 11 - 51 U/L  Comprehensive metabolic panel     Status: Abnormal   Collection Time: 02/21/20  1:23 AM  Result Value Ref Range   Sodium 134 (L) 135 - 145 mmol/L   Potassium 3.0 (L) 3.5 - 5.1 mmol/L   Chloride 103 98 - 111 mmol/L   CO2 15 (L) 22 - 32 mmol/L   Glucose, Bld 83 70 - 99 mg/dL   BUN 7 6 - 20 mg/dL   Creatinine, Ser 8.11 0.44 - 1.00 mg/dL   Calcium 9.8 8.9 - 57.2 mg/dL   Total Protein 7.7 6.5 - 8.1 g/dL   Albumin 3.8 3.5 - 5.0 g/dL   AST 25 15 - 41 U/L   ALT 26 0 - 44 U/L   Alkaline Phosphatase 48 38 - 126 U/L   Total Bilirubin 0.7 0.3 - 1.2 mg/dL   GFR calc non Af Amer >60 >60 mL/min   GFR calc Af Amer >60 >60 mL/min   Anion gap 16 (H) 5 - 15  Urinalysis, Routine w reflex  microscopic Urine, Clean Catch     Status: Abnormal   Collection Time: 02/21/20  2:51 AM  Result Value Ref Range   Color, Urine AMBER (A) YELLOW   APPearance HAZY (A) CLEAR   Specific Gravity, Urine 1.032 (H) 1.005 - 1.030   pH 6.0 5.0 - 8.0   Glucose, UA NEGATIVE NEGATIVE  mg/dL   Hgb urine dipstick NEGATIVE NEGATIVE   Bilirubin Urine NEGATIVE NEGATIVE   Ketones, ur 80 (A) NEGATIVE mg/dL   Protein, ur 237 (A) NEGATIVE mg/dL   Nitrite NEGATIVE NEGATIVE   Leukocytes,Ua LARGE (A) NEGATIVE   RBC / HPF 0-5 0 - 5 RBC/hpf   WBC, UA >50 (H) 0 - 5 WBC/hpf   Bacteria, UA RARE (A) NONE SEEN   Squamous Epithelial / LPF 6-10 0 - 5   Mucus PRESENT    Hyaline Casts, UA PRESENT     MRI ordered @ 0413, patient to MRI @0511  - patient started to become nauseous during MRI, Zofran 4mg  ordered IV  MRI returned to room and results pending @ [phone]  MRI report finalized @0744 , reviewed:  MR PELVIS WO CONTRAST  Result Date: 02/21/2020 CLINICAL DATA:  Right lower quadrant abdominal pain. Nausea and vomiting which began 3 days ago. Early pregnancy. EXAM: MRI ABDOMEN AND PELVIS WITHOUT CONTRAST TECHNIQUE: Multiplanar multisequence MR imaging of the abdomen and pelvis was performed. No intravenous contrast was administered. COMPARISON:  Abdominal ultrasound of 02/21/2020 FINDINGS: COMBINED FINDINGS FOR BOTH MR ABDOMEN AND PELVIS Despite efforts by the technologist and patient, motion artifact is present on today's exam and could not be eliminated. This reduces exam sensitivity and specificity. Lower chest: Only partially included, visualized portion unremarkable. Hepatobiliary: Unremarkable Pancreas:  Unremarkable Spleen:  Unremarkable Adrenals/Urinary Tract:  Unremarkable.  No hydroureter. Stomach/Bowel: A linear blind-ending tubular structure in the right lower quadrant shown between the inferior epigastric and iliac vessels and medial to the cecum on images 3 through 7 series 23 is thought to represent normal caliber appendix. Surrounding inflammatory findings. No dilated bowel noted. Vascular/Lymphatic: No pathologic adenopathy. No obvious vascular abnormality on today's noncontrast examination. Reproductive: Single intrauterine pregnancy. Amount of amniotic fluid appears reasonably  normal. Please note that today's exam was not protocol to to assess the embryo. Other:  Trace amount of free pelvic fluid in the cul-de-sac. Musculoskeletal: Unremarkable IMPRESSION: 1. A linear blind-ending tubular structure in the right lower quadrant is thought to represent normal caliber appendix. No surrounding inflammatory findings. 2. Single intrauterine pregnancy. Amount of amniotic fluid appears reasonably normal. 3. Trace amount of free pelvic fluid in the cul-de-sac. Electronically Signed   By: M.D.   On: 02/21/2020 07:44   MR ABDOMEN WO CONTRAST  Result Date: 02/21/2020 CLINICAL DATA:  Right lower quadrant abdominal pain. Nausea and vomiting which began 3 days ago. Early pregnancy. EXAM: MRI ABDOMEN AND PELVIS WITHOUT CONTRAST TECHNIQUE: Multiplanar multisequence MR imaging of the abdomen and pelvis was performed. No intravenous contrast was administered. COMPARISON:  Abdominal ultrasound of 02/21/2020 FINDINGS: COMBINED FINDINGS FOR BOTH MR ABDOMEN AND PELVIS Despite efforts by the technologist and patient, motion artifact is present on today's exam and could not be eliminated. This reduces exam sensitivity and specificity. Lower chest: Only partially included, visualized portion unremarkable. Hepatobiliary: Unremarkable Pancreas:  Unremarkable Spleen:  Unremarkable Adrenals/Urinary Tract:  Unremarkable.  No hydroureter. Stomach/Bowel: A linear blind-ending tubular structure in the right lower quadrant shown between the inferior epigastric and iliac vessels and medial to the cecum on images 3 through  7 series 23 is thought to represent normal caliber appendix. Surrounding inflammatory findings. No dilated bowel noted. Vascular/Lymphatic: No pathologic adenopathy. No obvious vascular abnormality on today's noncontrast examination. Reproductive: Single intrauterine pregnancy. Amount of amniotic fluid appears reasonably normal. Please note that today's exam was not protocol to to  assess the embryo. Other:  Trace amount of free pelvic fluid in the cul-de-sac. Musculoskeletal: Unremarkable IMPRESSION: 1. A linear blind-ending tubular structure in the right lower quadrant is thought to represent normal caliber appendix. No surrounding inflammatory findings. 2. Single intrauterine pregnancy. Amount of amniotic fluid appears reasonably normal. 3. Trace amount of free pelvic fluid in the cul-de-sac. Electronically Signed   By: Gaylyn Rong M.D.   On: 02/21/2020 07:44   Discussed results of labs and US/MRI with patient. Patient reports that epigastric pain is resolved and RLQ is getting better. Urine culture in progress and will call patient based on results - patient reports recent treatment of UTI with Keflex. Educated and discussed use of zofran at home for nausea/vomiting, encouraged to decrease acidic foods and increase water consumption, patient verbalizes understanding.   Discussed reasons to return to MAU. Follow up as scheduled in the office for NOB appointment. Return to MAU as needed. Pt stable at time of discharge. Rx for zofran sent to pharmacy of choice.   Assessment and Plan   1. Nausea and vomiting during pregnancy   2. Abdominal pain   3. Acute epigastric pain   4. [redacted] weeks gestation of pregnancy    Discharge home Follow up as scheduled in the office for prenatal care Return to MAU as needed for reasons discussed and/or emergencies  Rx for zofran    Allergies as of 02/21/2020   No Known Allergies     Medication List    STOP taking these medications   cephALEXin 500 MG capsule Commonly known as: KEFLEX   ibuprofen 600 MG tablet Commonly known as: ADVIL   methocarbamol 500 MG tablet Commonly known as: ROBAXIN   naproxen 500 MG tablet Commonly known as: NAPROSYN     TAKE these medications   ondansetron 4 MG disintegrating tablet Commonly known as: Zofran ODT Take 1 tablet (4 mg total) by mouth every 8 (eight) hours as needed for nausea or  vomiting.       Sharyon Cable CNM 02/21/2020, 8:03 AM

## 2020-02-21 NOTE — MAU Note (Signed)
..  Kristin Hunt is a 19 y.o. at Unknown here in MAU reporting: N/V that began 3 days ago. Pt states that she is vomiting "every 10-15 minutes" pt also reports constant abdominal pain that began around the same time the vomiting began.   LMP: beginning of july Onset of complaint: 3 days ago (02/18/2020) Pain score: 10/10 Vitals:   02/21/20 0058  BP: 120/83  Pulse: 75  Resp: 18  Temp: (!) 97.5 F (36.4 C)  SpO2: 100%      Lab orders placed from triage: UA

## 2020-02-22 ENCOUNTER — Encounter: Payer: Medicaid Other | Admitting: Family Medicine

## 2020-02-22 LAB — CULTURE, OB URINE

## 2020-02-25 ENCOUNTER — Encounter (HOSPITAL_COMMUNITY): Payer: Self-pay | Admitting: Obstetrics and Gynecology

## 2020-02-25 ENCOUNTER — Inpatient Hospital Stay (HOSPITAL_COMMUNITY)
Admission: AD | Admit: 2020-02-25 | Discharge: 2020-02-26 | Disposition: A | Discharge status: Home / Self Care | Payer: Medicaid Other | Attending: Obstetrics and Gynecology | Admitting: Obstetrics and Gynecology

## 2020-02-25 ENCOUNTER — Other Ambulatory Visit: Payer: Self-pay

## 2020-02-25 DIAGNOSIS — O99891 Other specified diseases and conditions complicating pregnancy: Secondary | ICD-10-CM | POA: Diagnosis not present

## 2020-02-25 DIAGNOSIS — O23591 Infection of other part of genital tract in pregnancy, first trimester: Secondary | ICD-10-CM

## 2020-02-25 DIAGNOSIS — Z7722 Contact with and (suspected) exposure to environmental tobacco smoke (acute) (chronic): Secondary | ICD-10-CM | POA: Diagnosis not present

## 2020-02-25 DIAGNOSIS — O209 Hemorrhage in early pregnancy, unspecified: Secondary | ICD-10-CM | POA: Insufficient documentation

## 2020-02-25 DIAGNOSIS — O26891 Other specified pregnancy related conditions, first trimester: Secondary | ICD-10-CM | POA: Insufficient documentation

## 2020-02-25 DIAGNOSIS — O98311 Other infections with a predominantly sexual mode of transmission complicating pregnancy, first trimester: Secondary | ICD-10-CM | POA: Diagnosis not present

## 2020-02-25 DIAGNOSIS — O219 Vomiting of pregnancy, unspecified: Secondary | ICD-10-CM | POA: Diagnosis present

## 2020-02-25 DIAGNOSIS — R12 Heartburn: Secondary | ICD-10-CM | POA: Diagnosis not present

## 2020-02-25 DIAGNOSIS — A599 Trichomoniasis, unspecified: Secondary | ICD-10-CM

## 2020-02-25 DIAGNOSIS — Z3A10 10 weeks gestation of pregnancy: Secondary | ICD-10-CM | POA: Diagnosis not present

## 2020-02-25 DIAGNOSIS — A5901 Trichomonal vulvovaginitis: Secondary | ICD-10-CM | POA: Diagnosis present

## 2020-02-25 LAB — URINALYSIS, ROUTINE W REFLEX MICROSCOPIC
Bilirubin Urine: NEGATIVE
Glucose, UA: NEGATIVE mg/dL
Ketones, ur: 80 mg/dL — AB
Nitrite: NEGATIVE
Protein, ur: 100 mg/dL — AB
Specific Gravity, Urine: 1.03 (ref 1.005–1.030)
pH: 6 (ref 5.0–8.0)

## 2020-02-25 LAB — WET PREP, GENITAL
Clue Cells Wet Prep HPF POC: NONE SEEN
Sperm: NONE SEEN
Yeast Wet Prep HPF POC: NONE SEEN

## 2020-02-25 MED ORDER — FAMOTIDINE IN NACL 20-0.9 MG/50ML-% IV SOLN
20.0000 mg | Freq: Once | INTRAVENOUS | Status: AC
  Administered 2020-02-25: 20 mg via INTRAVENOUS
  Filled 2020-02-25: qty 50

## 2020-02-25 MED ORDER — THIAMINE HCL 100 MG/ML IJ SOLN
Freq: Once | INTRAVENOUS | Status: AC
  Filled 2020-02-25: qty 1000

## 2020-02-25 MED ORDER — LACTATED RINGERS IV BOLUS
1000.0000 mL | Freq: Once | INTRAVENOUS | Status: AC
  Administered 2020-02-25: 1000 mL via INTRAVENOUS

## 2020-02-25 MED ORDER — PROMETHAZINE HCL 25 MG/ML IJ SOLN
25.0000 mg | Freq: Once | INTRAMUSCULAR | Status: AC
  Administered 2020-02-25: 25 mg via INTRAVENOUS
  Filled 2020-02-25: qty 1

## 2020-02-25 NOTE — MAU Note (Signed)
Pt stated she was here 4 days ago. Can;t keep anything down. Taking Zofran with out relief having burning in her throat when she vomits. Also c/o some spotting that started 2 days ago with some cramping

## 2020-02-25 NOTE — MAU Provider Note (Signed)
Chief Complaint: Nausea, Emesis, and Vaginal Bleeding   First Provider Initiated Contact with Patient 02/25/20 2230     SUBJECTIVE HPI: Kristin Hunt is a 19 y.o. G2P1001 at [redacted]w[redacted]d who presents to Maternity Admissions reporting nausea & vomiting. This has been ongoing with the pregnancy. Was seen in MAU 4 days ago for same; was sent home with prescription zofran. Last took zofran this morning. Has vomited 6 times today & continues to be nauseated. States she's been unable to keep down food or fluids. Has continued to have heartburn since previous MAU visit; is not taking anything for that.  Also reports some spotting on toilet paper for the last few days. No bleeding into pad. Some abdominal cramping. No recent intercourse or abnormal discharge.   Location: abdomen Quality: cramping Severity: 4/10 on pain scale Duration: 2 days Timing: intermittent Modifying factors: none Associated signs and symptoms: n/v, vaginal spotting  Past Medical History:  Diagnosis Date  . Chlamydia   . Pseudoseizures    OB History  Gravida Para Term Preterm AB Living  2 1 1  0 0 1  SAB TAB Ectopic Multiple Live Births  0 0 0 0 1    # Outcome Date GA Lbr Len/2nd Weight Sex Delivery Anes PTL Lv  2 Current           1 Term 01/04/18 [redacted]w[redacted]d 20:40 / 03:49 3484 g M Vag-Vacuum EPI  LIV   Past Surgical History:  Procedure Laterality Date  . WISDOM TOOTH EXTRACTION     2018   Social History   Socioeconomic History  . Marital status: Single    Spouse name: Not on file  . Number of children: Not on file  . Years of education: Not on file  . Highest education level: Not on file  Occupational History  . Not on file  Tobacco Use  . Smoking status: Never Smoker  . Smokeless tobacco: Never Used  Vaping Use  . Vaping Use: Never used  Substance and Sexual Activity  . Alcohol use: No  . Drug use: No  . Sexual activity: Not Currently  Other Topics Concern  . Not on file  Social History Narrative   Lives at  home with brother, father and step mom. Moved to [place] from [place] in August 2017. 2 dogs in the home. Both parents smoke in the home.    Social Determinants of Health   Financial Resource Strain:   . Difficulty of Paying Living Expenses: Not on file  Food Insecurity:   . Worried About September 2017 in the Last Year: Not on file  . Ran Out of Food in the Last Year: Not on file  Transportation Needs:   . Lack of Transportation (Medical): Not on file  . Lack of Transportation (Non-Medical): Not on file  Physical Activity:   . Days of Exercise per Week: Not on file  . Minutes of Exercise per Session: Not on file  Stress:   . Feeling of Stress : Not on file  Social Connections:   . Frequency of Communication with Friends and Family: Not on file  . Frequency of Social Gatherings with Friends and Family: Not on file  . Attends Religious Services: Not on file  . Active Member of Clubs or Organizations: Not on file  . Attends Programme researcher, broadcasting/film/video Meetings: Not on file  . Marital Status: Not on file  Intimate Partner Violence:   . Fear of Current or Ex-Partner: Not on file  . Emotionally  Abused: Not on file  . Physically Abused: Not on file  . Sexually Abused: Not on file   Family History  Problem Relation Age of Onset  . Diabetes Paternal Grandmother   . Lung cancer Paternal Grandmother    No current facility-administered medications on file prior to encounter.   Current Outpatient Medications on File Prior to Encounter  Medication Sig Dispense Refill  . ondansetron (ZOFRAN ODT) 4 MG disintegrating tablet Take 1 tablet (4 mg total) by mouth every 8 (eight) hours as needed for nausea or vomiting. 30 tablet 2   No Known Allergies  I have reviewed patient's Past Medical Hx, Surgical Hx, Family Hx, Social Hx, medications and allergies.   Review of Systems  Constitutional: Negative.   Gastrointestinal: Positive for abdominal pain, constipation, nausea and vomiting. Negative  for diarrhea.  Genitourinary: Positive for vaginal bleeding. Negative for dysuria and vaginal discharge.    OBJECTIVE Patient Vitals for the past 24 hrs:  BP Temp Pulse Resp SpO2 Height Weight  02/26/20 0150 105/63 -- 72 15 100 % -- --  02/25/20 2157 103/82 (!) 97.5 F (36.4 C) 70 18 -- 5\' 3"  (1.6 m) 62.4 kg   Constitutional: Well-developed, well-nourished female in no acute distress.  Cardiovascular: normal rate & rhythm, no murmur Respiratory: normal rate and effort. Lung sounds clear throughout GI: Abd soft, non-tender, Pos BS x 4. No guarding or rebound tenderness MS: Extremities nontender, no edema, normal ROM Neurologic: Alert and oriented x 4.  GU:  No blood   LAB RESULTS Results for orders placed or performed during the hospital encounter of 02/25/20 (from the past 24 hour(s))  Urinalysis, Routine w reflex microscopic Urine, Clean Catch     Status: Abnormal   Collection Time: 02/25/20 10:24 PM  Result Value Ref Range   Color, Urine AMBER (A) YELLOW   APPearance HAZY (A) CLEAR   Specific Gravity, Urine 1.030 1.005 - 1.030   pH 6.0 5.0 - 8.0   Glucose, UA NEGATIVE NEGATIVE mg/dL   Hgb urine dipstick SMALL (A) NEGATIVE   Bilirubin Urine NEGATIVE NEGATIVE   Ketones, ur 80 (A) NEGATIVE mg/dL   Protein, ur 02/27/20 (A) NEGATIVE mg/dL   Nitrite NEGATIVE NEGATIVE   Leukocytes,Ua LARGE (A) NEGATIVE   RBC / HPF 6-10 0 - 5 RBC/hpf   WBC, UA 21-50 0 - 5 WBC/hpf   Bacteria, UA RARE (A) NONE SEEN   Squamous Epithelial / LPF 0-5 0 - 5   Mucus PRESENT   Wet prep, genital     Status: Abnormal   Collection Time: 02/25/20 11:08 PM   Specimen: Vaginal  Result Value Ref Range   Yeast Wet Prep HPF POC NONE SEEN NONE SEEN   Trich, Wet Prep PRESENT (A) NONE SEEN   Clue Cells Wet Prep HPF POC NONE SEEN NONE SEEN   WBC, Wet Prep HPF POC MANY (A) NONE SEEN   Sperm NONE SEEN     IMAGING No results found.  MAU COURSE Orders Placed This Encounter  Procedures  . Wet prep, genital  .  Urinalysis, Routine w reflex microscopic Urine, Clean Catch  . Discharge patient   Meds ordered this encounter  Medications  . lactated ringers bolus 1,000 mL  . famotidine (PEPCID) IVPB 20 mg premix  . promethazine (PHENERGAN) injection 25 mg  . sodium chloride 0.9 % 1,000 mL with thiamine 100 mg, folic acid 1 mg, multivitamins adult 10 mL infusion  . metroNIDAZOLE (FLAGYL) IVPB 500 mg    Order  Specific Question:   Antibiotic Indication:    Answer:   Other Indication (list below)    Order Specific Question:   Other Indication:    Answer:   trichomonas  . scopolamine (TRANSDERM-SCOP) 1 MG/3DAYS 1.5 mg  . scopolamine (TRANSDERM-SCOP) 1 MG/3DAYS    Sig: Place 1 patch (1.5 mg total) onto the skin every 3 (three) days.    Dispense:  4 patch    Refill:  1    Order Specific Question:   Supervising Provider    Answer:   [place] Bing [id number]  . metoCLOPramide (REGLAN) 10 MG tablet    Sig: Take 1 tablet (10 mg total) by mouth every 8 (eight) hours as needed for nausea.    Dispense:  30 tablet    Refill:  0    Order Specific Question:   Supervising Provider    Answer:   [place] Bing [id number]  . famotidine (PEPCID) 20 MG tablet    Sig: Take 1 tablet (20 mg total) by mouth daily.    Dispense:  30 tablet    Refill:  0    Order Specific Question:   Supervising Provider    Answer:   [place] Bing [[id number]]    MDM U/a with 80 of ketones & high SG. Offered IV fluids & meds. IV phenergan & pepcid given. LR bolus followed by banana bag. Will rx reglan to take at home; limit zofran dosing due to constipation side effect. Will also start on daily omeprazole as well.   Wet prep & GC/CT collected due to VB & abd cramping. No blood on exam. Wet prep positive for trichomonas. After c/w Dr. Vergie Living & pharmacy, will give IV flagyl 500 mg due to vomiting.   ASSESSMENT 1. Nausea and vomiting during pregnancy prior to [redacted] weeks gestation   2. Heartburn during pregnancy in first trimester    3. [redacted] weeks gestation of pregnancy   4. Trichomonal vaginitis during pregnancy in first trimester     PLAN Discharge home in stable condition. GC/CT pending Rx pepcid & reglan Decrease usage of zofran Scop patch applied prior to discharge & rx sent Reviewed reasons to return to MAU   Allergies as of 02/26/2020   No Known Allergies     Medication List    TAKE these medications   famotidine 20 MG tablet Commonly known as: PEPCID Take 1 tablet (20 mg total) by mouth daily.   metoCLOPramide 10 MG tablet Commonly known as: REGLAN Take 1 tablet (10 mg total) by mouth every 8 (eight) hours as needed for nausea.   ondansetron 4 MG disintegrating tablet Commonly known as: Zofran ODT Take 1 tablet (4 mg total) by mouth every 8 (eight) hours as needed for nausea or vomiting.   scopolamine 1 MG/3DAYS Commonly known as: TRANSDERM-SCOP Place 1 patch (1.5 mg total) onto the skin every 3 (three) days.        Judeth Horn, NP 02/26/2020  3:10 AM

## 2020-02-26 ENCOUNTER — Encounter (HOSPITAL_COMMUNITY): Payer: Self-pay | Admitting: Obstetrics and Gynecology

## 2020-02-26 DIAGNOSIS — O23591 Infection of other part of genital tract in pregnancy, first trimester: Secondary | ICD-10-CM | POA: Diagnosis present

## 2020-02-26 LAB — GC/CHLAMYDIA PROBE AMP (~~LOC~~) NOT AT ARMC
Chlamydia: NEGATIVE
Comment: NEGATIVE
Comment: NORMAL
Neisseria Gonorrhea: NEGATIVE

## 2020-02-26 MED ORDER — SCOPOLAMINE 1 MG/3DAYS TD PT72
1.0000 | MEDICATED_PATCH | TRANSDERMAL | 1 refills | Status: DC
Start: 2020-02-26 — End: 2020-08-23

## 2020-02-26 MED ORDER — FAMOTIDINE 20 MG PO TABS: 20.0000 mg | ORAL_TABLET | Freq: Every day | ORAL | 0 refills | Status: DC

## 2020-02-26 MED ORDER — METRONIDAZOLE IN NACL 5-0.79 MG/ML-% IV SOLN
500.0000 mg | Freq: Once | INTRAVENOUS | Status: AC
  Administered 2020-02-26: 500 mg via INTRAVENOUS
  Filled 2020-02-26: qty 100

## 2020-02-26 MED ORDER — METOCLOPRAMIDE HCL 10 MG PO TABS: 10.0000 mg | ORAL_TABLET | Freq: Three times a day (TID) | ORAL | 0 refills | Status: DC | PRN

## 2020-02-26 MED ORDER — SCOPOLAMINE 1 MG/3DAYS TD PT72
1.0000 | MEDICATED_PATCH | Freq: Once | TRANSDERMAL | Status: DC
  Administered 2020-02-26: 1.5 mg via TRANSDERMAL
  Filled 2020-02-26: qty 1

## 2020-02-26 NOTE — Discharge Instructions (Signed)
Morning Sickness  Morning sickness is when a woman feels nauseous during pregnancy. This nauseous feeling may or may not come with vomiting. It often occurs in the morning, but it can be a problem at any time of day. Morning sickness is most common during the first trimester. In some cases, it may continue throughout pregnancy. Although morning sickness is unpleasant, it is usually harmless unless the woman develops severe and continual vomiting (hyperemesis gravidarum), a condition that requires more intense treatment. What are the causes? The exact cause of this condition is not known, but it seems to be related to normal hormonal changes that occur in pregnancy. What increases the risk? You are more likely to develop this condition if:  You experienced nausea or vomiting before your pregnancy.  You had morning sickness during a previous pregnancy.  You are pregnant with more than one baby, such as twins. What are the signs or symptoms? Symptoms of this condition include:  Nausea.  Vomiting. How is this diagnosed? This condition is usually diagnosed based on your signs and symptoms. How is this treated? In many cases, treatment is not needed for this condition. Making some changes to what you eat may help to control symptoms. Your health care provider may also prescribe or recommend:  Vitamin B6 supplements.  Anti-nausea medicines.  Ginger. Follow these instructions at home: Medicines  Take over-the-counter and prescription medicines only as told by your health care provider. Do not use any prescription, over-the-counter, or herbal medicines for morning sickness without first talking with your health care provider.  Taking multivitamins before getting pregnant can prevent or decrease the severity of morning sickness in most women. Eating and drinking  Eat a piece of dry toast or crackers before getting out of bed in the morning.  Eat 5 or 6 small meals a day.  Eat dry and  bland foods, such as rice or a baked potato. Foods that are high in carbohydrates are often helpful.  Avoid greasy, fatty, and spicy foods.  Have someone cook for you if the smell of any food causes nausea and vomiting.  If you feel nauseous after taking prenatal vitamins, take the vitamins at night or with a snack.  Snack on protein foods between meals if you are hungry. Nuts, yogurt, and cheese are good options.  Drink fluids throughout the day.  Try ginger ale made with real ginger, ginger tea made from fresh grated ginger, or ginger candies. General instructions  Do not use any products that contain nicotine or tobacco, such as cigarettes and e-cigarettes. If you need help quitting, ask your health care provider.  Get an air purifier to keep the air in your house free of odors.  Get plenty of fresh air.  Try to avoid odors that trigger your nausea.  Consider trying these methods to help relieve symptoms: ? Wearing an acupressure wristband. These wristbands are often worn for seasickness. ? Acupuncture. Contact a health care provider if:  Your home remedies are not working and you need medicine.  You feel dizzy or light-headed.  You are losing weight. Get help right away if:  You have persistent and uncontrolled nausea and vomiting.  You faint.  You have severe pain in your abdomen. Summary  Morning sickness is when a woman feels nauseous during pregnancy. This nauseous feeling may or may not come with vomiting.  Morning sickness is most common during the first trimester.  It often occurs in the morning, but it can be a problem at   any time of day.  In many cases, treatment is not needed for this condition. Making some changes to what you eat may help to control symptoms. This information is not intended to replace advice given to you by your health care provider. Make sure you discuss any questions you have with your health care provider. Document Revised:  05/13/2017 Document Reviewed: 07/03/2016 Elsevier Patient Education  2020 [employer]. Trichomoniasis Trichomoniasis is an STI (sexually transmitted infection) that can affect both women and men. In women, the outer area of the female genitalia (vulva) and the vagina are affected. In men, mainly the penis is affected, but the prostate and other reproductive organs can also be involved.  This condition can be treated with medicine. It often has no symptoms (is asymptomatic), especially in men. If not treated, trichomoniasis can last for months or years. What are the causes? This condition is caused by a parasite called Trichomonas vaginalis. Trichomoniasis most often spreads from person to person (is contagious) through sexual contact. What increases the risk? The following factors may make you more likely to develop this condition:  Having unprotected sex.  Having sex with a partner who has trichomoniasis.  Having multiple sexual partners.  Having had previous trichomoniasis infections or other STIs. What are the signs or symptoms? In women, symptoms of trichomoniasis include:  Abnormal vaginal discharge that is clear, white, gray, or yellow-green and foamy and has an unusual "fishy" odor.  Itching and irritation of the vagina and vulva.  Burning or pain during urination or sex.  Redness and swelling of the genitals. In men, symptoms of trichomoniasis include:  Penile discharge that may be foamy or contain pus.  Pain in the penis. This may happen only when urinating.  Itching or irritation inside the penis.  Burning after urination or ejaculation. How is this diagnosed? In women, this condition may be found during a routine Pap test or physical exam. It may be found in men during a routine physical exam. Your health care provider may do tests to help diagnose this infection, such as:  Urine tests (men and women).  The following in women: ? Testing the pH of the  vagina. ? A vaginal swab test that checks for the Trichomonas vaginalis parasite. ? Testing vaginal secretions. Your health care provider may test you for other STIs, including HIV (human immunodeficiency virus). How is this treated? This condition is treated with medicine taken by mouth (orally), such as metronidazole or tinidazole, to fight the infection. Your sexual partner(s) also need to be tested and treated.  If you are a woman and you plan to become pregnant or think you may be pregnant, tell your health care provider right away. Some medicines that are used to treat the infection should not be taken during pregnancy. Your health care provider may recommend over-the-counter medicines or creams to help relieve itching or irritation. You may be tested for infection again 3 months after treatment. Follow these instructions at home:  Take and use over-the-counter and prescription medicines, including creams, only as told by your health care provider.  Take your antibiotic medicine as told by your health care provider. Do not stop taking the antibiotic even if you start to feel better.  Do not have sex until 7-10 days after you finish your medicine, or until your health care provider approves. Ask your health care provider when you may start to have sex again.  (Women) Do not douche or wear tampons while you have the infection.  Discuss your infection with your sexual partner(s). Make sure that your partner gets tested and treated, if necessary.  Keep all follow-up visits as told by your health care provider. This is important. How is this prevented?   Use condoms every time you have sex. Using condoms correctly and consistently can help protect against STIs.  Avoid having multiple sexual partners.  Talk with your sexual partner about any symptoms that either of you may have, as well as any history of STIs.  Get tested for STIs and STDs (sexually transmitted diseases) before you  have sex. Ask your partner to do the same.  Do not have sexual contact if you have symptoms of trichomoniasis or another STI. Contact a health care provider if:  You still have symptoms after you finish your medicine.  You develop pain in your abdomen.  You have pain when you urinate.  You have bleeding after sex.  You develop a rash.  You feel nauseous or you vomit.  You plan to become pregnant or think you may be pregnant. Summary  Trichomoniasis is an STI (sexually transmitted infection) that can affect both women and men.  This condition often has no symptoms (is asymptomatic), especially in men.  Without treatment, this condition can last for months or years.  You should not have sex until 7-10 days after you finish your medicine, or until your health care provider approves. Ask your health care provider when you may start to have sex again.  Discuss your infection with your sexual partner(s). Make sure that your partner gets tested and treated, if necessary. This information is not intended to replace advice given to you by your health care provider. Make sure you discuss any questions you have with your health care provider. Document Revised: 03/14/2018 Document Reviewed: 03/14/2018 Elsevier Patient Education  2020 [employer].

## 2020-05-07 ENCOUNTER — Encounter: Payer: Self-pay | Admitting: Family Medicine

## 2020-05-07 ENCOUNTER — Other Ambulatory Visit: Payer: Self-pay

## 2020-05-07 ENCOUNTER — Other Ambulatory Visit (HOSPITAL_COMMUNITY)
Admission: RE | Admit: 2020-05-07 | Discharge: 2020-05-07 | Disposition: A | Discharge status: Home / Self Care | Payer: Medicaid Other | Source: Ambulatory Visit | Attending: Family Medicine | Admitting: Family Medicine

## 2020-05-07 ENCOUNTER — Ambulatory Visit (INDEPENDENT_AMBULATORY_CARE_PROVIDER_SITE_OTHER): Payer: Medicaid Other | Admitting: Family Medicine

## 2020-05-07 VITALS — BP 106/60 | HR 68 | Ht 63.0 in | Wt 157.0 lb

## 2020-05-07 DIAGNOSIS — N760 Acute vaginitis: Secondary | ICD-10-CM | POA: Diagnosis not present

## 2020-05-07 DIAGNOSIS — Z3201 Encounter for pregnancy test, result positive: Secondary | ICD-10-CM

## 2020-05-07 DIAGNOSIS — Z3042 Encounter for surveillance of injectable contraceptive: Secondary | ICD-10-CM

## 2020-05-07 DIAGNOSIS — R21 Rash and other nonspecific skin eruption: Secondary | ICD-10-CM | POA: Diagnosis not present

## 2020-05-07 DIAGNOSIS — B9689 Other specified bacterial agents as the cause of diseases classified elsewhere: Secondary | ICD-10-CM | POA: Insufficient documentation

## 2020-05-07 DIAGNOSIS — Z113 Encounter for screening for infections with a predominantly sexual mode of transmission: Secondary | ICD-10-CM | POA: Insufficient documentation

## 2020-05-07 LAB — POCT WET PREP (WET MOUNT)
Clue Cells Wet Prep Whiff POC: POSITIVE
Trichomonas Wet Prep HPF POC: ABSENT
WBC, Wet Prep HPF POC: >20

## 2020-05-07 LAB — POCT URINE PREGNANCY: Preg Test, Ur: POSITIVE — AB

## 2020-05-07 MED ORDER — METRONIDAZOLE 500 MG PO TABS: 500.0000 mg | ORAL_TABLET | Freq: Two times a day (BID) | ORAL | 0 refills | Status: AC

## 2020-05-07 MED ORDER — TRIAMCINOLONE ACETONIDE 0.1 % EX CREA: 1.0000 "application " | TOPICAL_CREAM | Freq: Two times a day (BID) | CUTANEOUS | 0 refills | Status: DC

## 2020-05-07 NOTE — Assessment & Plan Note (Signed)
Confirmed on wet prep. G/C Percell Locus is pending. Symptoms consistent with this.  - Treatment: Flagyl 500 BID x 7 days and abstain from coitus during course of treatment. Advised patient to not drink alcohol while taking this medication.  - F/U if symptoms not improving or getting worse.  - Will f/u on G/C Chlamydia and call in Rx if positive.  - Self care instructions given including avoiding douching. Handout given.  - F/U with PCP as needed.  - Return precautions including abdominal pain, fever, chills, nausea, or vomiting given.   High risk heterosexual behavior GC and chlamydia DNA  probe sent to lab. HIV and RPR collected. Advised patient to use barrier protection/condoms.  Per chart review patient treated for trichomoniasis on 02/26/2020.

## 2020-05-07 NOTE — Assessment & Plan Note (Signed)
Contact versus irritant dermatitis treat with Kenalog.  Follow-up if not improving

## 2020-05-07 NOTE — Patient Instructions (Addendum)
It was great seeing you today!   I'd like to see you back Monday for repeat pregnancy test but if you need to be seen earlier than that for any new issues we're happy to fit you in, just give [place] a call!  Regarding your bacterial vaginosis: Stop by the pharmacy to pick up your antibiotics. DO NOT drink alcohol while taking this mediations (as advised).    If you have questions or concerns please do not hesitate to call at [phone].  Dr. [facility]   Bacterial Vaginosis  Bacterial vaginosis is an infection of the vagina. It happens when too many normal germs (healthy bacteria) grow in the vagina. This infection puts you at risk for infections from sex (STIs). Treating this infection can lower your risk for some STIs. You should also treat this if you are pregnant. It can cause your baby to be born early. Follow these instructions at home: Medicines  Take over-the-counter and prescription medicines only as told by your doctor.  Take or use your antibiotic medicine as told by your doctor. Do not stop taking or using it even if you start to feel better. General instructions  If you your sexual partner is a woman, tell her that you have this infection. She needs to get treatment if she has symptoms. If you have a female partner, he does not need to be treated.  During treatment: ? Avoid sex. ? Do not douche. ? Avoid alcohol as told. ? Avoid breastfeeding as told.  Drink enough fluid to keep your pee (urine) clear or pale yellow.  Keep your vagina and butt (rectum) clean. ? Wash the area with warm water every day. ? Wipe from front to back after you use the toilet.  Keep all follow-up visits as told by your doctor. This is important. Preventing this condition  Do not douche.  Use only warm water to wash around your vagina.  Use protection when you have sex. This includes: ? Latex condoms. ? Dental dams.  Limit how many people you have sex  with. It is best to only have sex with the same person (be monogamous).  Get tested for STIs. Have your partner get tested.  Wear underwear that is cotton or lined with cotton.  Avoid tight pants and pantyhose. This is most important in summer.  Do not use any products that have nicotine or tobacco in them. These include cigarettes and e-cigarettes. If you need help quitting, ask your doctor.  Do not use illegal drugs.  Limit how much alcohol you drink. Contact a doctor if:  Your symptoms do not get better, even after you are treated.  You have more discharge or pain when you pee (urinate).  You have a fever.  You have pain in your belly (abdomen).  You have pain with sex.  Your bleed from your vagina between periods. Summary  This infection happens when too many germs (bacteria) grow in the vagina.  Treating this condition can lower your risk for some infections from sex (STIs).  You should also treat this if you are pregnant. It can cause early (premature) birth.  Do not stop taking or using your antibiotic medicine even if you start to feel better. This information is not intended to replace advice given to you by your health care provider. Make sure you discuss any questions you have with your health care provider. Document Revised: 05/13/2017 Document Reviewed: 02/14/2016 Elsevier Patient Education  2020 [employer].

## 2020-05-07 NOTE — Assessment & Plan Note (Addendum)
Patient given Depo in September 2021.  Positive urine pregnancy test today.  On exam today there was scant vaginal bleeding.  Quantitative hCG ordered for today with repeat on Monday 11/29.  Ordered OB ultrasound to look for products of conception. Unsure if this is a new pregnancy or pt has retained IUP

## 2020-05-07 NOTE — Progress Notes (Signed)
      SUBJECTIVE:   CHIEF COMPLAINT / HPI:   CC: STI testing   Kristin Hunt is a 19 y.o. female presents for STI testing.   STI check Pateint states she was tested at [facility] and was told she had Trichomonasis. Reports never being treated. Previously treated for Chlamydia in 2019. No symptoms in herself or in partner.  - Symptoms:  - Medications tried: none - Sexually active with 1 female  Partner  - Contraception: does not use condoms, none Symptoms - Abnormal vaginal discharge: white and yellow  - Missed period: none (recent abortion in October) - Fever: no - Abdominal/Pelvic pain: yes - Vaginal bleeding: yes (menistrating )  - Pain during sex: no - Rash: thigh rash   Recent Abortion Patient had abortion in [place] at [facility] at [redacted] weeks gestation. Believes she is menstruating though this is a lighter period. Does not use condoms with her partner. Of note, pt struggled to determine what month and the location of her abortion.   PERTINENT  PMH / PSH: reviewed and updated as appropriate   OBJECTIVE:   BP 106/60   Pulse 68   Ht 5\' 3"  (1.6 m)   Wt 157 lb (71.2 kg)   LMP 12/13/2019   SpO2 98%   Breastfeeding Unknown   BMI 27.81 kg/m   GEN: well appearing female in no acute distress  CVS: well perfused  RESP: speaking in full sentences without pause  ABD: soft, non-tender, non-distended, no palpable masses  Pelvic exam: normal external genitalia, vulva, VAGINA and CERVIX: lesions absent, cervical discharge present - creamy and foul, WET MOUNT done - results: KOH done, clue cells, + Whiff test, DNA probe for chlamydia and GC obtained, exam chaperoned by CMA.  SKIN: Small irregular hyperpigmented patches on right thigh    ASSESSMENT/PLAN:   BV (bacterial vaginosis)  Confirmed on wet prep. G/C 02/13/2020 is pending. Symptoms consistent with this.  - Treatment: Flagyl 500 BID x 7 days and abstain from coitus during course of treatment. Advised patient to not  drink alcohol while taking this medication.  - F/U if symptoms not improving or getting worse.  - Will f/u on G/C Chlamydia and call in Rx if positive.  - Self care instructions given including avoiding douching. Handout given.  - F/U with PCP as needed.  - Return precautions including abdominal pain, fever, chills, nausea, or vomiting given.   High risk heterosexual behavior GC and chlamydia DNA  probe sent to lab. HIV and RPR collected. Advised patient to use barrier protection/condoms.  Per chart review patient treated for trichomoniasis on 02/26/2020.  Positive pregnancy test Patient given Depo in September 2021.  Positive urine pregnancy test today.  On exam today there was scant vaginal bleeding.  Quantitative hCG ordered for today with repeat on Monday 11/29.  Ordered OB ultrasound to look for products of conception. Unsure if this is a new pregnancy or pt has retained IUP   Skin rash Contact versus irritant dermatitis treat with Kenalog.  Follow-up if not improving      12/29, DO [facility]

## 2020-05-08 LAB — HCG, SERUM, QUALITATIVE: hCG,Beta Subunit,Qual,Serum: NEGATIVE m[IU]/mL (ref <6)

## 2020-05-08 LAB — RPR: RPR Ser Ql: NONREACTIVE

## 2020-05-08 LAB — HIV ANTIBODY (ROUTINE TESTING W REFLEX): HIV Screen 4th Generation wRfx: NONREACTIVE

## 2020-05-08 LAB — HEPATITIS C ANTIBODY: Hep C Virus Ab: 1.3 s/co ratio — ABNORMAL HIGH (ref 0.0–0.9)

## 2020-05-09 LAB — CERVICOVAGINAL ANCILLARY ONLY
Chlamydia: NEGATIVE
Comment: NEGATIVE
Comment: NORMAL
Neisseria Gonorrhea: NEGATIVE

## 2020-05-12 ENCOUNTER — Other Ambulatory Visit: Payer: Medicaid Other

## 2020-05-12 ENCOUNTER — Telehealth: Payer: Self-pay | Admitting: Family Medicine

## 2020-05-12 ENCOUNTER — Other Ambulatory Visit: Payer: Self-pay | Admitting: Family Medicine

## 2020-05-12 ENCOUNTER — Other Ambulatory Visit: Payer: Self-pay

## 2020-05-12 DIAGNOSIS — Z3201 Encounter for pregnancy test, result positive: Secondary | ICD-10-CM

## 2020-05-12 DIAGNOSIS — B1920 Unspecified viral hepatitis C without hepatic coma: Secondary | ICD-10-CM

## 2020-05-12 NOTE — Telephone Encounter (Signed)
Reviewed lab work with patient. Discussed positive Hep C screen. Lab appointment scheduled for tomorrow.   Katha Cabal, DO PGY-2, [facility] 05/12/2020 1:43 PM

## 2020-05-13 ENCOUNTER — Other Ambulatory Visit: Payer: Medicaid Other

## 2020-05-13 LAB — HCG, SERUM, QUALITATIVE: hCG,Beta Subunit,Qual,Serum: NEGATIVE m[IU]/mL (ref <6)

## 2020-06-11 ENCOUNTER — Ambulatory Visit: Payer: Medicaid Other | Admitting: Family Medicine

## 2020-06-12 ENCOUNTER — Ambulatory Visit: Payer: Medicaid Other | Admitting: Family Medicine

## 2020-06-12 NOTE — Progress Notes (Deleted)
   SUBJECTIVE:   CHIEF COMPLAINT / HPI:   No chief complaint on file.    Kristin Hunt is a 19 y.o. female here for ***   Hep C Screening  PT had positive for Hep C antibody screening test. She denies ***sharing needles or ***IV drug use.     PERTINENT  PMH / PSH: *** , reviewed and updated as appropriate   OBJECTIVE:   LMP 12/17/2019 (Approximate)   ***  ASSESSMENT/PLAN:   No problem-specific Assessment & Plan notes found for this encounter.     Katha Cabal, DO PGY-2, [facility] 06/12/2020

## 2020-06-14 NOTE — L&D Delivery Note (Addendum)
OB/GYN Faculty Practice Delivery Note  Kristin Hunt is a 20 y.o. G3P1011 s/p VD at [redacted]w[redacted]d. She was admitted for eIOL.   ROM: 6h 20m with clear to light mec fluid GBS Status:  Positive/-- (09/27 1108) Maximum Maternal Temperature: 98.8  Labor Progress: Initial SVE: 4/60/-3. S/p pitocin. She then progressed to complete.   Delivery Date/Time: 03/29/21 @1616  Delivery: Called to room and patient was complete and pushing. Head delivered LOA. No nuchal cord present. Shoulder and body delivered in usual fashion. Infant with spontaneous cry, placed on mother's abdomen, dried and stimulated. Cord clamped x 2 after 1-minute delay, and cut by father of baby. Cord blood drawn. Placenta delivered spontaneously with gentle cord traction. Fundus firm with massage and Pitocin. Labia, perineum, vagina, and cervix inspected with good hemostasis and lacerations.  Baby Weight: pending  Placenta: 3 vessel, intact. Sent to L&D Complications: None Lacerations: None EBL: 20 mL Analgesia: Maternally supported   Infant:  APGAR (1 MIN): 8   APGAR (5 MINS): 9    Simone Autry-Lott, DO 03/29/2021, 4:46 PM PGY-3, [facility]  I was present for the entire delivery of baby and placenta and inspection of perineum and agree with above. Shoulders delivered easily.   03/31/2021, Katrinka Blazing, CNM 03/29/2021 7:30 PM

## 2020-06-17 ENCOUNTER — Ambulatory Visit: Payer: Medicaid Other | Admitting: Family Medicine

## 2020-06-17 ENCOUNTER — Other Ambulatory Visit: Payer: Self-pay

## 2020-06-24 ENCOUNTER — Ambulatory Visit: Payer: Medicaid Other | Admitting: Family Medicine

## 2020-07-21 ENCOUNTER — Ambulatory Visit: Payer: Medicaid Other

## 2020-08-22 ENCOUNTER — Ambulatory Visit: Payer: Medicaid Other

## 2020-08-23 ENCOUNTER — Encounter (HOSPITAL_COMMUNITY): Payer: Self-pay | Admitting: Family Medicine

## 2020-08-23 ENCOUNTER — Inpatient Hospital Stay (HOSPITAL_COMMUNITY)
Admission: AD | Admit: 2020-08-23 | Discharge: 2020-08-23 | Disposition: A | Discharge status: Home / Self Care | Payer: Medicaid Other | Attending: Family Medicine | Admitting: Family Medicine

## 2020-08-23 ENCOUNTER — Inpatient Hospital Stay (HOSPITAL_COMMUNITY): Payer: Medicaid Other

## 2020-08-23 ENCOUNTER — Other Ambulatory Visit: Payer: Self-pay

## 2020-08-23 DIAGNOSIS — O26891 Other specified pregnancy related conditions, first trimester: Secondary | ICD-10-CM | POA: Diagnosis not present

## 2020-08-23 DIAGNOSIS — O210 Mild hyperemesis gravidarum: Secondary | ICD-10-CM | POA: Diagnosis not present

## 2020-08-23 DIAGNOSIS — O26899 Other specified pregnancy related conditions, unspecified trimester: Secondary | ICD-10-CM

## 2020-08-23 DIAGNOSIS — O219 Vomiting of pregnancy, unspecified: Secondary | ICD-10-CM | POA: Diagnosis present

## 2020-08-23 DIAGNOSIS — R109 Unspecified abdominal pain: Secondary | ICD-10-CM | POA: Diagnosis not present

## 2020-08-23 DIAGNOSIS — Z3A08 8 weeks gestation of pregnancy: Secondary | ICD-10-CM

## 2020-08-23 DIAGNOSIS — Z3491 Encounter for supervision of normal pregnancy, unspecified, first trimester: Secondary | ICD-10-CM

## 2020-08-23 LAB — CBC
HCT: 37.7 % (ref 36.0–46.0)
Hemoglobin: 12.7 g/dL (ref 12.0–15.0)
MCH: 26.2 pg (ref 26.0–34.0)
MCHC: 33.7 g/dL (ref 30.0–36.0)
MCV: 77.9 fL — ABNORMAL LOW (ref 80.0–100.0)
Platelets: 365 10*3/uL (ref 150–400)
RBC: 4.84 MIL/uL (ref 3.87–5.11)
RDW: 13.3 % (ref 11.5–15.5)
WBC: 7.6 10*3/uL (ref 4.0–10.5)
nRBC: 0 % (ref 0.0–0.2)

## 2020-08-23 LAB — URINALYSIS, ROUTINE W REFLEX MICROSCOPIC
Bacteria, UA: NONE SEEN
Bilirubin Urine: NEGATIVE
Glucose, UA: NEGATIVE mg/dL
Ketones, ur: 80 mg/dL — AB
Leukocytes,Ua: NEGATIVE
Nitrite: NEGATIVE
Protein, ur: 30 mg/dL — AB
Specific Gravity, Urine: 1.021 (ref 1.005–1.030)
pH: 6 (ref 5.0–8.0)

## 2020-08-23 LAB — WET PREP, GENITAL
Sperm: NONE SEEN
Trich, Wet Prep: NONE SEEN
Yeast Wet Prep HPF POC: NONE SEEN

## 2020-08-23 LAB — POCT PREGNANCY, URINE: Preg Test, Ur: POSITIVE — AB

## 2020-08-23 LAB — HCG, QUANTITATIVE, PREGNANCY: hCG, Beta Chain, Quant, S: 126763 m[IU]/mL — ABNORMAL HIGH (ref <5)

## 2020-08-23 MED ORDER — SCOPOLAMINE 1 MG/3DAYS TD PT72: 1.0000 | MEDICATED_PATCH | TRANSDERMAL | 1 refills | Status: DC

## 2020-08-23 MED ORDER — FAMOTIDINE IN NACL 20-0.9 MG/50ML-% IV SOLN
20.0000 mg | Freq: Once | INTRAVENOUS | Status: AC
  Administered 2020-08-23: 20 mg via INTRAVENOUS
  Filled 2020-08-23: qty 50

## 2020-08-23 MED ORDER — PROMETHAZINE HCL 25 MG/ML IJ SOLN
25.0000 mg | Freq: Once | INTRAMUSCULAR | Status: AC
  Administered 2020-08-23: 25 mg via INTRAVENOUS
  Filled 2020-08-23: qty 1

## 2020-08-23 MED ORDER — FAMOTIDINE 20 MG PO TABS: 20.0000 mg | ORAL_TABLET | Freq: Every day | ORAL | 1 refills | Status: DC

## 2020-08-23 MED ORDER — M.V.I. ADULT IV INJ
Freq: Once | INTRAVENOUS | Status: DC
  Filled 2020-08-23: qty 10

## 2020-08-23 MED ORDER — LACTATED RINGERS IV BOLUS
1000.0000 mL | Freq: Once | INTRAVENOUS | Status: AC
  Administered 2020-08-23: 1000 mL via INTRAVENOUS

## 2020-08-23 MED ORDER — METOCLOPRAMIDE HCL 10 MG PO TABS: 10.0000 mg | ORAL_TABLET | Freq: Three times a day (TID) | ORAL | 0 refills | Status: DC | PRN

## 2020-08-23 MED ORDER — M.V.I. ADULT IV INJ
Freq: Once | INTRAVENOUS | Status: AC
  Filled 2020-08-23: qty 10

## 2020-08-23 MED ORDER — ONDANSETRON 4 MG PO TBDP: 4.0000 mg | ORAL_TABLET | Freq: Three times a day (TID) | ORAL | 2 refills | Status: DC | PRN

## 2020-08-23 MED ORDER — ONDANSETRON HCL 4 MG/2ML IJ SOLN
4.0000 mg | Freq: Once | INTRAMUSCULAR | Status: AC
  Administered 2020-08-23: 4 mg via INTRAVENOUS
  Filled 2020-08-23: qty 2

## 2020-08-23 NOTE — Discharge Instructions (Signed)
Safe Medications in Pregnancy   Acne: Benzoyl Peroxide Salicylic Acid  Backache/Headache: Tylenol: 2 regular strength every 4 hours OR              2 Extra strength every 6 hours  Colds/Coughs/Allergies: Benadryl (alcohol free) 25 mg every 6 hours as needed Breath right strips Claritin Cepacol throat lozenges Chloraseptic throat spray Cold-Eeze- up to three times per day Cough drops, alcohol free Flonase (by prescription only) Guaifenesin Mucinex Robitussin DM (plain only, alcohol free) Saline nasal spray/drops Sudafed (pseudoephedrine) & Actifed ** use only after [redacted] weeks gestation and if you do not have high blood pressure Tylenol Vicks Vaporub Zinc lozenges Zyrtec   Constipation: Colace Ducolax suppositories Fleet enema Glycerin suppositories Metamucil Milk of magnesia Miralax Senokot Smooth move tea  Diarrhea: Kaopectate Imodium A-D  *NO pepto Bismol  Hemorrhoids: Anusol Anusol HC Preparation H Tucks  Indigestion: Tums Maalox Mylanta Zantac  Pepcid  Insomnia: Benadryl (alcohol free) 25mg every 6 hours as needed Tylenol PM Unisom, no Gelcaps  Leg Cramps: Tums MagGel  Nausea/Vomiting:  Bonine Dramamine Emetrol Ginger extract Sea bands Meclizine  Nausea medication to take during pregnancy:  Unisom (doxylamine succinate 25 mg tablets) Take one tablet daily at bedtime. If symptoms are not adequately controlled, the dose can be increased to a maximum recommended dose of two tablets daily (1/2 tablet in the morning, 1/2 tablet mid-afternoon and one at bedtime). Vitamin B6 100mg tablets. Take one tablet twice a day (up to 200 mg per day).  Skin Rashes: Aveeno products Benadryl cream or 25mg every 6 hours as needed Calamine Lotion 1% cortisone cream  Yeast infection: Gyne-lotrimin 7 Monistat 7   **If taking multiple medications, please check labels to avoid duplicating the same active ingredients **take medication as directed on  the label ** Do not exceed 4000 mg of tylenol in 24 hours **Do not take medications that contain aspirin or ibuprofen    Obstetrics: Normal and Problem Pregnancies (7th ed., pp. 102-121). [place], [place]: Elsevier."> Textbook of Family Medicine (9th ed., pp. 365-410). [place], [place]: Else[employer].">  First Trimester of Pregnancy  The first trimester of pregnancy starts on the first day of your last menstrual period until the end of week 12. This is months 1 through 3 of pregnancy. A week after a sperm fertilizes an egg, the egg will implant into the wall of the uterus and begin to develop into a baby. By the end of 12 weeks, all the baby's organs will be formed and the baby will be 2-3 inches in size. Body changes during your first trimester Your body goes through many changes during pregnancy. The changes vary and generally return to normal after your baby is born. Physical changes  You may gain or lose weight.  Your breasts may begin to grow larger and become tender. The tissue that surrounds your nipples (areola) may become darker.  Dark spots or blotches (chloasma or mask of pregnancy) may develop on your face.  You may have changes in your hair. These can include thickening or thinning of your hair or changes in texture. Health changes  You may feel nauseous, and you may vomit.  You may have heartburn.  You may develop headaches.  You may develop constipation.  Your gums may bleed and may be sensitive to brushing and flossing. Other changes  You may tire easily.  You may urinate more often.  Your menstrual periods will stop.  You may have a loss of appetite.  You may develop   cravings for certain kinds of food.  You may have changes in your emotions from day to day.  You may have more vivid and strange dreams. Follow these instructions at home: Medicines  Follow your health care provider's instructions regarding medicine use. Specific medicines may be  either safe or unsafe to take during pregnancy. Do not take any medicines unless told to by your health care provider.  Take a prenatal vitamin that contains at least 600 micrograms (mcg) of folic acid. Eating and drinking  Eat a healthy diet that includes fresh fruits and vegetables, whole grains, good sources of protein such as meat, eggs, or tofu, and low-fat dairy products.  Avoid raw meat and unpasteurized juice, milk, and cheese. These carry germs that can harm you and your baby.  If you feel nauseous or you vomit: ? Eat 4 or 5 small meals a day instead of 3 large meals. ? Try eating a few soda crackers. ? Drink liquids between meals instead of during meals.  You may need to take these actions to prevent or treat constipation: ? Drink enough fluid to keep your urine pale yellow. ? Eat foods that are high in fiber, such as beans, whole grains, and fresh fruits and vegetables. ? Limit foods that are high in fat and processed sugars, such as fried or sweet foods. Activity  Exercise only as directed by your health care provider. Most people can continue their usual exercise routine during pregnancy. Try to exercise for 30 minutes at least 5 days a week.  Stop exercising if you develop pain or cramping in the lower abdomen or lower back.  Avoid exercising if it is very hot or humid or if you are at high altitude.  Avoid heavy lifting.  If you choose to, you may have sex unless your health care provider tells you not to. Relieving pain and discomfort  Wear a good support bra to relieve breast tenderness.  Rest with your legs elevated if you have leg cramps or low back pain.  If you develop bulging veins (varicose veins) in your legs: ? Wear support hose as told by your health care provider. ? Elevate your feet for 15 minutes, 3-4 times a day. ? Limit salt in your diet. Safety  Wear your seat belt at all times when driving or riding in a car.  Talk with your health care  provider if someone is verbally or physically abusive to you.  Talk with your health care provider if you are feeling sad or have thoughts of hurting yourself. Lifestyle  Do not use hot tubs, steam rooms, or saunas.  Do not douche. Do not use tampons or scented sanitary pads.  Do not use herbal remedies, alcohol, illegal drugs, or medicines that are not approved by your health care provider. Chemicals in these products can harm your baby.  Do not use any products that contain nicotine or tobacco, such as cigarettes, e-cigarettes, and chewing tobacco. If you need help quitting, ask your health care provider.  Avoid cat litter boxes and soil used by cats. These carry germs that can cause birth defects in the baby and possibly loss of the unborn baby (fetus) by miscarriage or stillbirth. General instructions  During routine prenatal visits in the first trimester, your health care provider will do a physical exam, perform necessary tests, and ask you how things are going. Keep all follow-up visits. This is important.  Ask for help if you have counseling or nutritional needs during pregnancy. Your   health care provider can offer advice or refer you to specialists for help with various needs.  Schedule a dentist appointment. At home, brush your teeth with a soft toothbrush. Floss gently.  Write down your questions. Take them to your prenatal visits. Where to find more information  American Pregnancy Association: americanpregnancy.org  American College of Obstetricians and Gynecologists: acog.org/en/Womens%20Health/Pregnancy  Office on Women's Health: womenshealth.gov/pregnancy Contact a health care provider if you have:  Dizziness.  A fever.  Mild pelvic cramps, pelvic pressure, or nagging pain in the abdominal area.  Nausea, vomiting, or diarrhea that lasts for 24 hours or longer.  A bad-smelling vaginal discharge.  Pain when you urinate.  Known exposure to a contagious illness,  such as chickenpox, measles, Zika virus, HIV, or hepatitis. Get help right away if you have:  Spotting or bleeding from your vagina.  Severe abdominal cramping or pain.  Shortness of breath or chest pain.  Any kind of trauma, such as from a fall or a car crash.  New or increased pain, swelling, or redness in an arm or leg. Summary  The first trimester of pregnancy starts on the first day of your last menstrual period until the end of week 12 (months 1 through 3).  Eating 4 or 5 small meals a day rather than 3 large meals may help to relieve nausea and vomiting.  Do not use any products that contain nicotine or tobacco, such as cigarettes, e-cigarettes, and chewing tobacco. If you need help quitting, ask your health care provider.  Keep all follow-up visits. This is important. This information is not intended to replace advice given to you by your health care provider. Make sure you discuss any questions you have with your health care provider. Document Revised: 11/07/2019 Document Reviewed: 09/13/2019 Elsevier Patient Education  2021 Elsevier Inc.  

## 2020-08-23 NOTE — MAU Note (Signed)
Unable to keep anything down the last 3 days.  Pain in stomach. Denies vag bleeding.  Has not been seen or had preg confirmed.

## 2020-08-23 NOTE — MAU Provider Note (Signed)
History     CSN: [id number]  Arrival date and time: 08/23/20 1125   Event Date/Time   First Provider Initiated Contact with Patient 08/23/20 1206      Chief Complaint  Patient presents with  . Abdominal Pain  . Emesis  . Possible Pregnancy   HPI Kristin Hunt is a 20 y.o. G3P1001 at [redacted]w[redacted]d who presents with nausea and vomiting and abdominal pain. She states she has vomited countless times and is unable to eat or drink anything. She reports the pain is upper abdominal pain and is burning. She also reports some spotting when she wiped.   OB History    Gravida  3   Para  1   Term  1   Preterm  0   AB  0   Living  1     SAB  0   IAB  0   Ectopic  0   Multiple  0   Live Births  1           Past Medical History:  Diagnosis Date  . Chlamydia   . 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 WISDOM TOOTH EXTRACTION     2018    Family History  Problem Relation Age of Onset  . Diabetes Paternal Grandmother   . Lung cancer Paternal Grandmother     Social History   Tobacco Use  . Smoking status: Never Smoker  . Smokeless tobacco: Never Used  Vaping Use  . Vaping Use: Never used  Substance Use Topics  . Alcohol use: No  . Drug use: No    Allergies: No Known Allergies  Medications Prior to Admission  Medication Sig Dispense Refill Last Dose  . famotidine (PEPCID) 20 MG tablet Take 1 tablet (20 mg total) by mouth daily. 30 tablet 0   . metoCLOPramide (REGLAN) 10 MG tablet Take 1 tablet (10 mg total) by mouth every 8 (eight) hours as needed for nausea. 30 tablet 0   . ondansetron (ZOFRAN ODT) 4 MG disintegrating tablet Take 1 tablet (4 mg total) by mouth every 8 (eight) hours as needed for nausea or vomiting. 30 tablet 2   . scopolamine (TRANSDERM-SCOP) 1 MG/3DAYS Place 1 patch (1.5 mg total) onto the skin every 3 (three) days. 4 patch 1   . triamcinolone (KENALOG) 0.1 % Apply 1 application topically 2 (two) times  daily. 30 g 0     Review of Systems  Constitutional: Negative.  Negative for fatigue and fever.  HENT: Negative.   Respiratory: Negative.  Negative for shortness of breath.   Cardiovascular: Negative.  Negative for chest pain.  Gastrointestinal: Positive for abdominal pain, nausea and vomiting. Negative for constipation and diarrhea.  Genitourinary: Positive for vaginal bleeding. Negative for dysuria and vaginal discharge.  Neurological: Negative.  Negative for dizziness and headaches.   Physical Exam   Blood pressure 134/76, pulse 83, temperature 98.1 F (36.7 C), temperature source Oral, resp. rate 20, height 5\' 3"  (1.6 m), weight 69.3 kg, last menstrual period 06/28/2020, SpO2 100 %, unknown if currently breastfeeding.  Physical Exam Vitals and nursing note reviewed.  Constitutional:      General: She is not in acute distress.    Appearance: She is well-developed.  HENT:     Head: Normocephalic.  Eyes:     Pupils: Pupils are equal, round, and reactive to light.  Cardiovascular:     Rate and Rhythm: Normal rate and regular rhythm.  Heart sounds: Normal heart sounds.  Pulmonary:     Effort: Pulmonary effort is normal. No respiratory distress.     Breath sounds: Normal breath sounds.  Abdominal:     General: Bowel sounds are normal. There is no distension.     Palpations: Abdomen is soft.     Tenderness: There is no abdominal tenderness.  Skin:    General: Skin is warm and dry.  Neurological:     Mental Status: She is alert and oriented to person, place, and time.  Psychiatric:        Behavior: Behavior normal.        Thought Content: Thought content normal.        Judgment: Judgment normal.     MAU Course  Procedures Results for orders placed or performed during the hospital encounter of 08/23/20 (from the past 24 hour(s))  Urinalysis, Routine w reflex microscopic Urine, Clean Catch     Status: Abnormal   Collection Time: 08/23/20 11:40 AM  Result Value Ref  Range   Color, Urine YELLOW YELLOW   APPearance CLEAR CLEAR   Specific Gravity, Urine 1.021 1.005 - 1.030   pH 6.0 5.0 - 8.0   Glucose, UA NEGATIVE NEGATIVE mg/dL   Hgb urine dipstick MODERATE (A) NEGATIVE   Bilirubin Urine NEGATIVE NEGATIVE   Ketones, ur 80 (A) NEGATIVE mg/dL   Protein, ur 30 (A) NEGATIVE mg/dL   Nitrite NEGATIVE NEGATIVE   Leukocytes,Ua NEGATIVE NEGATIVE   RBC / HPF 0-5 0 - 5 RBC/hpf   WBC, UA 6-10 0 - 5 WBC/hpf   Bacteria, UA NONE SEEN NONE SEEN   Squamous Epithelial / LPF 0-5 0 - 5   Mucus PRESENT   Pregnancy, urine POC     Status: Abnormal   Collection Time: 08/23/20 11:42 AM  Result Value Ref Range   Preg Test, Ur POSITIVE (A) NEGATIVE  CBC     Status: Abnormal   Collection Time: 08/23/20 11:59 AM  Result Value Ref Range   WBC 7.6 4.0 - 10.5 K/uL   RBC 4.84 3.87 - 5.11 MIL/uL   Hemoglobin 12.7 12.0 - 15.0 g/dL   HCT 23.5 57.3 - 22.0 %   MCV 77.9 (L) 80.0 - 100.0 fL   MCH 26.2 26.0 - 34.0 pg   MCHC 33.7 30.0 - 36.0 g/dL   RDW 25.4 27.0 - 62.3 %   Platelets 365 150 - 400 K/uL   nRBC 0.0 0.0 - 0.2 %  hCG, quantitative, pregnancy     Status: Abnormal   Collection Time: 08/23/20 11:59 AM  Result Value Ref Range   hCG, Beta Chain, Quant, S 126,763 (H) <5 mIU/mL   US OB Comp Less 14 Wks  Result Date: 08/23/2020 CLINICAL DATA:  Pain, spotting, positive beta HCG EXAM: OBSTETRIC <14 WK ULTRASOUND TECHNIQUE: Transabdominal ultrasound was performed for evaluation of the gestation as well as the maternal uterus and adnexal regions. COMPARISON:  None. FINDINGS: Intrauterine gestational sac: Single Yolk sac:  Visualized. Embryo:  Visualized. Cardiac Activity: Visualized. Heart Rate: 189 bpm CRL:   21.1 mm   8 w 5 d                  [place] EDC: 03/30/2021 Subchorionic hemorrhage:  None visualized. Maternal uterus/adnexae: Bilateral ovaries within normal limits. No free fluid within the pelvis. IMPRESSION: Single live intrauterine gestation measuring 8 weeks 5 days by  crown-rump length. Active embryonic heart tones at 189 bpm. Electronically Signed   By: Janyth Pupa  Plundo D.O.   On: 08/23/2020 14:40   MDM UA, UPT CBC, HCG ABO/Rh- B Pos Wet prep and gc/chlamydia US OB Comp Less 14 weeks with Transvaginal  LR bolus Phenergan IV Pepcid IV Multivitamin infusion Zofran IV  Assessment and Plan   1. Normal intrauterine pregnancy on prenatal ultrasound in first trimester   2. Abdominal pain affecting pregnancy   3. [redacted] weeks gestation of pregnancy   4. Nausea/vomiting in pregnancy    -Discharge home in stable condition -Rx for reglan, phenergan, zofran and pepcid sent to patient's pharmacy -First trimester precautions discussed -Patient advised to follow-up with OB to establish prenatal care -Patient may return to MAU as needed or if her condition were to change or worsen   Rolm Bookbinder CNM 08/23/2020, 12:06 PM

## 2020-08-23 NOTE — Progress Notes (Signed)
Upon assessment patient stated that she started bleeding after she was triaged. She has a pad on with small red blood.

## 2020-08-24 ENCOUNTER — Inpatient Hospital Stay (HOSPITAL_COMMUNITY)
Admission: AD | Admit: 2020-08-24 | Discharge: 2020-08-25 | Disposition: A | Discharge status: Home / Self Care | Payer: Medicaid Other | Attending: Obstetrics & Gynecology | Admitting: Obstetrics & Gynecology

## 2020-08-24 ENCOUNTER — Encounter (HOSPITAL_COMMUNITY): Payer: Self-pay | Admitting: Obstetrics & Gynecology

## 2020-08-24 ENCOUNTER — Encounter (HOSPITAL_COMMUNITY): Payer: Self-pay | Admitting: Family Medicine

## 2020-08-24 ENCOUNTER — Inpatient Hospital Stay (HOSPITAL_COMMUNITY)
Admission: AD | Admit: 2020-08-24 | Discharge: 2020-08-24 | Disposition: A | Discharge status: Home / Self Care | Payer: Medicaid Other | Attending: Advanced Practice Midwife | Admitting: Advanced Practice Midwife

## 2020-08-24 ENCOUNTER — Other Ambulatory Visit: Payer: Self-pay

## 2020-08-24 DIAGNOSIS — F17210 Nicotine dependence, cigarettes, uncomplicated: Secondary | ICD-10-CM | POA: Insufficient documentation

## 2020-08-24 DIAGNOSIS — O219 Vomiting of pregnancy, unspecified: Secondary | ICD-10-CM | POA: Diagnosis not present

## 2020-08-24 DIAGNOSIS — O26891 Other specified pregnancy related conditions, first trimester: Secondary | ICD-10-CM | POA: Diagnosis not present

## 2020-08-24 DIAGNOSIS — Z79899 Other long term (current) drug therapy: Secondary | ICD-10-CM | POA: Insufficient documentation

## 2020-08-24 DIAGNOSIS — R109 Unspecified abdominal pain: Secondary | ICD-10-CM | POA: Insufficient documentation

## 2020-08-24 DIAGNOSIS — O26899 Other specified pregnancy related conditions, unspecified trimester: Secondary | ICD-10-CM

## 2020-08-24 DIAGNOSIS — O26851 Spotting complicating pregnancy, first trimester: Secondary | ICD-10-CM | POA: Diagnosis not present

## 2020-08-24 DIAGNOSIS — R102 Pelvic and perineal pain: Secondary | ICD-10-CM | POA: Diagnosis not present

## 2020-08-24 DIAGNOSIS — O99331 Smoking (tobacco) complicating pregnancy, first trimester: Secondary | ICD-10-CM | POA: Insufficient documentation

## 2020-08-24 DIAGNOSIS — O209 Hemorrhage in early pregnancy, unspecified: Secondary | ICD-10-CM | POA: Diagnosis present

## 2020-08-24 DIAGNOSIS — Z3A08 8 weeks gestation of pregnancy: Secondary | ICD-10-CM | POA: Insufficient documentation

## 2020-08-24 LAB — CBC
HCT: 35.8 % — ABNORMAL LOW (ref 36.0–46.0)
Hemoglobin: 11.7 g/dL — ABNORMAL LOW (ref 12.0–15.0)
MCH: 25.8 pg — ABNORMAL LOW (ref 26.0–34.0)
MCHC: 32.7 g/dL (ref 30.0–36.0)
MCV: 78.9 fL — ABNORMAL LOW (ref 80.0–100.0)
Platelets: 310 10*3/uL (ref 150–400)
RBC: 4.54 MIL/uL (ref 3.87–5.11)
RDW: 13.4 % (ref 11.5–15.5)
WBC: 7.8 10*3/uL (ref 4.0–10.5)
nRBC: 0 % (ref 0.0–0.2)

## 2020-08-24 LAB — URINALYSIS, ROUTINE W REFLEX MICROSCOPIC
Bacteria, UA: NONE SEEN
Bilirubin Urine: NEGATIVE
Glucose, UA: NEGATIVE mg/dL
Hgb urine dipstick: NEGATIVE
Ketones, ur: 80 mg/dL — AB
Leukocytes,Ua: NEGATIVE
Nitrite: NEGATIVE
Protein, ur: 30 mg/dL — AB
Specific Gravity, Urine: 1.026 (ref 1.005–1.030)
pH: 6 (ref 5.0–8.0)

## 2020-08-24 LAB — GC/CHLAMYDIA PROBE AMP (~~LOC~~) NOT AT ARMC
Chlamydia: NEGATIVE
Comment: NEGATIVE
Comment: NORMAL
Neisseria Gonorrhea: NEGATIVE

## 2020-08-24 MED ORDER — FAMOTIDINE IN NACL 20-0.9 MG/50ML-% IV SOLN
20.0000 mg | Freq: Once | INTRAVENOUS | Status: AC
  Administered 2020-08-24: 20 mg via INTRAVENOUS
  Filled 2020-08-24: qty 50

## 2020-08-24 MED ORDER — ACETAMINOPHEN 500 MG PO TABS
1000.0000 mg | ORAL_TABLET | Freq: Once | ORAL | Status: AC
  Administered 2020-08-24: 1000 mg via ORAL
  Filled 2020-08-24: qty 2

## 2020-08-24 MED ORDER — PROMETHAZINE HCL 25 MG/ML IJ SOLN
25.0000 mg | Freq: Once | INTRAMUSCULAR | Status: AC
  Administered 2020-08-24: 25 mg via INTRAVENOUS
  Filled 2020-08-24: qty 1

## 2020-08-24 MED ORDER — LACTATED RINGERS IV BOLUS
1000.0000 mL | Freq: Once | INTRAVENOUS | Status: AC
  Administered 2020-08-24: 1000 mL via INTRAVENOUS

## 2020-08-24 MED ORDER — LACTATED RINGERS IV SOLN: 500.0000 mg | Freq: Once | INTRAVENOUS | Status: DC

## 2020-08-24 NOTE — MAU Provider Note (Signed)
History     CSN: [id number]  Arrival date and time: 08/24/20 0746   Event Date/Time   First Provider Initiated Contact with Patient 08/24/20 [phone]      Chief Complaint  Patient presents with  . Abdominal Pain  . Vaginal Bleeding   HPI Kristin Hunt is a 20 y.o. G3P1011 at [redacted]w[redacted]d who presents to MAU with chief complaint of abdominal pain. This is a new problem, onset at 2200 hours yesterday. Her pain is suprapubic, radiates to her lower back. Pain score is 8/10 for both pain sites. She has not taken medication or tried other treatments for this complaint. She denies dysuria, abdominal tenderness, fever.  Patient endorses secondary complaint of vaginal bleeding. This is a recurrent problem, for which patient was evaluated in MAU less than 24 hours ago. Her bleeding is scant to small, dark red. She is not saturating a pad. She is remote from sexual intercourse.  Patient also c/o recurrent nausea and vomiting. She states she feels a strong urge to vomit "but nothing comes up". She endorses multiple episodes of dry heaving "like something on my stomach and I have to get it up". She has not taken any antiemetics since she was discharged from MAU last night.   OB History    Gravida  3   Para  1   Term  1   Preterm  0   AB  1   Living  1     SAB  1   IAB  0   Ectopic  0   Multiple  0   Live Births  1           Past Medical History:  Diagnosis Date  . Chlamydia   . 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 WISDOM TOOTH EXTRACTION     2018    Family History  Problem Relation Age of Onset  . Diabetes Paternal Grandmother   . Lung cancer Paternal Grandmother     Social History   Tobacco Use  . Smoking status: Light Tobacco Smoker    Types: Cigarettes  . Smokeless tobacco: Never Used  . Tobacco comment: smoked two weeks ago  Vaping Use  . Vaping Use: Never used  Substance Use Topics  . Alcohol use: No  . Drug use: No     Allergies: No Known Allergies  Medications Prior to Admission  Medication Sig Dispense Refill Last Dose  . famotidine (PEPCID) 20 MG tablet Take 1 tablet (20 mg total) by mouth daily. 30 tablet 1   . metoCLOPramide (REGLAN) 10 MG tablet Take 1 tablet (10 mg total) by mouth every 8 (eight) hours as needed for nausea. 30 tablet 0   . ondansetron (ZOFRAN ODT) 4 MG disintegrating tablet Take 1 tablet (4 mg total) by mouth every 8 (eight) hours as needed for nausea or vomiting. 30 tablet 2   . scopolamine (TRANSDERM-SCOP) 1 MG/3DAYS Place 1 patch (1.5 mg total) onto the skin every 3 (three) days. 10 patch 1     Review of Systems  Gastrointestinal: Positive for abdominal pain.  Genitourinary: Positive for vaginal bleeding.  Musculoskeletal: Positive for back pain.  All other systems reviewed and are negative.  Physical Exam   Blood pressure 126/63, pulse (!) 58, temperature 98.3 F (36.8 C), resp. rate 18, last menstrual period 06/28/2020, SpO2 100 %, unknown if currently breastfeeding.  Physical Exam Vitals and nursing note reviewed. Exam conducted with a chaperone present.  Constitutional:      Appearance: She is well-developed. She is ill-appearing.  Cardiovascular:     Rate and Rhythm: Normal rate.     Heart sounds: Normal heart sounds.  Abdominal:     General: Bowel sounds are normal.     Palpations: Abdomen is soft.     Tenderness: There is no abdominal tenderness. There is no right CVA tenderness or left CVA tenderness.  Skin:    Capillary Refill: Capillary refill takes less than 2 seconds.  Neurological:     Mental Status: She is alert and oriented to person, place, and time.  Psychiatric:        Mood and Affect: Mood normal.        Behavior: Behavior normal.     MAU Course  Procedures  --Live IUP confirmed in MAU yesterday 08/23/2020 --IUP with cardiac flicker on BSUS in MAU this visit --CNM returned to bedside 70 minutes after Tylenol, patient alseep, reports  pain score 0/10 --Patient has active prescriptions for Pepcid, Reglan, Zofran ODT and Scopolamine. Encouraged her to take when available, slow bland grazing for PO intake, solids before sips of room temperature liquids  Patient Vitals for the past 24 hrs:  BP Temp Pulse Resp SpO2  08/24/20 1112 (!) 112/58 -- (!) 56 16 100 %  08/24/20 0825 126/63 98.3 F (36.8 C) (!) 58 18 100 %  08/24/20 0751 118/67 97.8 F (36.6 C) 76 (!) 24 98 %   Results for orders placed or performed during the hospital encounter of 08/24/20 (from the past 24 hour(s))  CBC     Status: Abnormal   Collection Time: 08/24/20  8:45 AM  Result Value Ref Range   WBC 7.8 4.0 - 10.5 K/uL   RBC 4.54 3.87 - 5.11 MIL/uL   Hemoglobin 11.7 (L) 12.0 - 15.0 g/dL   HCT 19.3 (L) 79.0 - 24.0 %   MCV 78.9 (L) 80.0 - 100.0 fL   MCH 25.8 (L) 26.0 - 34.0 pg   MCHC 32.7 30.0 - 36.0 g/dL   RDW 97.3 53.2 - 99.2 %   Platelets 310 150 - 400 K/uL   nRBC 0.0 0.0 - 0.2 %   Meds ordered this encounter  Medications  . acetaminophen (TYLENOL) tablet 1,000 mg  . promethazine (PHENERGAN) injection 25 mg  . famotidine (PEPCID) IVPB 20 mg premix  . lactated ringers bolus 1,000 mL   Assessment and Plan  --20 y.o. G3P1011 at [redacted]w[redacted]d  --Live IUP, FHT 150 via BSUS --Vaginal spotting, continue pelvic rest, bleeding precautions --Antiemetic regimen as previous advised --Blood type B POS --Discharge home in stable condition  Calvert Cantor, CNM 08/24/2020, 11:42 AM

## 2020-08-24 NOTE — Discharge Instructions (Signed)

## 2020-08-24 NOTE — ED Provider Notes (Signed)
Emergency Medicine Provider OB Triage Evaluation Note  Kristin Hunt is a 20 y.o. female, G3P1001, at [redacted]w[redacted]d gestation who presents to the emergency department with complaints of sharp lower abdominal pain.  She admits to associated dark vaginal bleeding.  No bright red blood.  Patient was evaluated in the MAU yesterday for nausea, vomiting, and abdominal pain and discharged with Zofran.  Patient states this pain feels different.  Review of  Systems  Positive: abdominal pain, vaginal bleeding Negative: fever  Physical Exam  BP 118/67 (BP Location: Left Arm)   Pulse 76   Temp 97.8 F (36.6 C)   Resp (!) 24   LMP 06/28/2020   SpO2 98%  General: Awake, no distress, appears uncomfortably in chair HEENT: Atraumatic  Resp: Normal effort  Cardiac: Normal rate Abd: Nondistended, tenderness throughout lower quadrants without rebound or guarding MSK: Moves all extremities without difficulty Neuro: Speech clear  Medical Decision Making  Pt evaluated for pregnancy concern and is stable for transfer to MAU. Pt is in agreement with plan for transfer.  8:02 AM Discussed with MAU APP, Sam, who accepts patient in transfer.  Clinical Impression  No diagnosis found.     Jesusita Oka 08/24/20 7017    Linwood Dibbles, MD 08/25/20 [phone]

## 2020-08-24 NOTE — MAU Provider Note (Signed)
History     CSN: [id number]  Arrival date and time: 08/24/20 2236   Event Date/Time   First Provider Initiated Contact with Patient 08/24/20 2300      Chief Complaint  Patient presents with  . Abdominal Pain   HPI Kristin Hunt is a 20 y.o. G3P1011 at [redacted]w[redacted]d who presents via EMS for abdominal pain. She reports middle abdominal pain that is constant. She reports the pain happened after she tried to eat a cracker. She states the pain is the same pain that she was seen for this AM. She has taken her antiemetics but did not try anything for the pain. She denies any bleeding or vaginal discharge. Patient denies mariajuana use.   OB History    Gravida  3   Para  1   Term  1   Preterm  0   AB  1   Living  1     SAB  1   IAB  0   Ectopic  0   Multiple  0   Live Births  1           Past Medical History:  Diagnosis Date  . Chlamydia   . 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 WISDOM TOOTH EXTRACTION     2018    Family History  Problem Relation Age of Onset  . Diabetes Paternal Grandmother   . Lung cancer Paternal Grandmother     Social History   Tobacco Use  . Smoking status: Light Tobacco Smoker    Types: Cigarettes  . Smokeless tobacco: Never Used  . Tobacco comment: smoked two weeks ago  Vaping Use  . Vaping Use: Never used  Substance Use Topics  . Alcohol use: No  . Drug use: No    Allergies: No Known Allergies  Medications Prior to Admission  Medication Sig Dispense Refill Last Dose  . famotidine (PEPCID) 20 MG tablet Take 1 tablet (20 mg total) by mouth daily. 30 tablet 1 08/24/2020 at Unknown time  . metoCLOPramide (REGLAN) 10 MG tablet Take 1 tablet (10 mg total) by mouth every 8 (eight) hours as needed for nausea. 30 tablet 0 08/24/2020 at Unknown time  . ondansetron (ZOFRAN ODT) 4 MG disintegrating tablet Take 1 tablet (4 mg total) by mouth every 8 (eight) hours as needed for nausea or vomiting. 30  tablet 2 08/24/2020 at Unknown time  . scopolamine (TRANSDERM-SCOP) 1 MG/3DAYS Place 1 patch (1.5 mg total) onto the skin every 3 (three) days. 10 patch 1 08/24/2020 at Unknown time    Review of Systems  Constitutional: Negative.  Negative for fatigue and fever.  HENT: Negative.   Respiratory: Negative.  Negative for shortness of breath.   Cardiovascular: Negative.  Negative for chest pain.  Gastrointestinal: Positive for abdominal pain, nausea and vomiting. Negative for constipation and diarrhea.  Genitourinary: Negative.  Negative for dysuria, vaginal bleeding and vaginal discharge.  Neurological: Negative.  Negative for dizziness and headaches.   Physical Exam   Last menstrual period 06/28/2020, unknown if currently breastfeeding.  Physical Exam Vitals and nursing note reviewed.  Constitutional:      General: She is not in acute distress.    Appearance: She is well-developed.  HENT:     Head: Normocephalic.  Eyes:     Pupils: Pupils are equal, round, and reactive to light.  Cardiovascular:     Rate and Rhythm: Normal rate and regular rhythm.  Heart sounds: Normal heart sounds.  Pulmonary:     Effort: Pulmonary effort is normal. No respiratory distress.     Breath sounds: Normal breath sounds.  Abdominal:     General: Bowel sounds are normal. There is no distension.     Palpations: Abdomen is soft.     Tenderness: There is no abdominal tenderness. There is no guarding or rebound.  Skin:    General: Skin is warm and dry.  Neurological:     Mental Status: She is alert and oriented to person, place, and time.  Psychiatric:        Behavior: Behavior normal.        Thought Content: Thought content normal.        Judgment: Judgment normal.     MAU Course  Procedures Results for orders placed or performed during the hospital encounter of 08/24/20 (from the past 24 hour(s))  Urinalysis, Routine w reflex microscopic Urine, Clean Catch     Status: Abnormal   Collection  Time: 08/24/20 11:04 PM  Result Value Ref Range   Color, Urine YELLOW YELLOW   APPearance HAZY (A) CLEAR   Specific Gravity, Urine 1.026 1.005 - 1.030   pH 6.0 5.0 - 8.0   Glucose, UA NEGATIVE NEGATIVE mg/dL   Hgb urine dipstick NEGATIVE NEGATIVE   Bilirubin Urine NEGATIVE NEGATIVE   Ketones, ur 80 (A) NEGATIVE mg/dL   Protein, ur 30 (A) NEGATIVE mg/dL   Nitrite NEGATIVE NEGATIVE   Leukocytes,Ua NEGATIVE NEGATIVE   RBC / HPF 0-5 0 - 5 RBC/hpf   WBC, UA 0-5 0 - 5 WBC/hpf   Bacteria, UA NONE SEEN NONE SEEN   Squamous Epithelial / LPF 0-5 0 - 5   Mucus PRESENT    MDM UA, UDS Tylenol PO- complete resolution of pain approximately 40 minutes after medication.  Able to tolerate PO  VSS, abdomen benign and complete resolution of pain with minimal intervention  Discussed options for pain management in pregnancy. Discussed taking antiemetics on schedule and slowly introducing foods over time.   Assessment and Plan   1. Abdominal pain affecting pregnancy   2. [redacted] weeks gestation of pregnancy    -Discharge home in stable condition -Abdominal pain precautions discussed -Patient advised to follow-up with OB as scheduled for prenatal care -Patient may return to MAU as needed or if her condition were to change or worsen   Rolm Bookbinder CNM 08/24/2020, 11:00 PM

## 2020-08-24 NOTE — MAU Note (Signed)
Pt reports to mau from [facility] with c/o lower abd cramping since 10pm last night.  Pt also reporting some dark red bleeding and is having to wear a pad.  Denies any recent intercourse.

## 2020-08-24 NOTE — MAU Note (Signed)
Patient presents to MAU via EMS complaining of an increase in abdominal pain from her last visit earlier today. The pain feels like "something is eating my stomach." Denies vaginal bleeding.

## 2020-08-25 DIAGNOSIS — Z79899 Other long term (current) drug therapy: Secondary | ICD-10-CM | POA: Diagnosis not present

## 2020-08-25 DIAGNOSIS — Z3A08 8 weeks gestation of pregnancy: Secondary | ICD-10-CM | POA: Diagnosis not present

## 2020-08-25 DIAGNOSIS — O99331 Smoking (tobacco) complicating pregnancy, first trimester: Secondary | ICD-10-CM | POA: Diagnosis not present

## 2020-08-25 DIAGNOSIS — F17210 Nicotine dependence, cigarettes, uncomplicated: Secondary | ICD-10-CM | POA: Diagnosis not present

## 2020-08-25 DIAGNOSIS — O26851 Spotting complicating pregnancy, first trimester: Secondary | ICD-10-CM | POA: Diagnosis not present

## 2020-08-25 DIAGNOSIS — O26891 Other specified pregnancy related conditions, first trimester: Secondary | ICD-10-CM | POA: Diagnosis not present

## 2020-08-25 DIAGNOSIS — R109 Unspecified abdominal pain: Secondary | ICD-10-CM | POA: Diagnosis not present

## 2020-08-25 NOTE — Discharge Instructions (Signed)
Safe Medications in Pregnancy   Acne: Benzoyl Peroxide Salicylic Acid  Backache/Headache: Tylenol: 2 regular strength every 4 hours OR              2 Extra strength every 6 hours  Colds/Coughs/Allergies: Benadryl (alcohol free) 25 mg every 6 hours as needed Breath right strips Claritin Cepacol throat lozenges Chloraseptic throat spray Cold-Eeze- up to three times per day Cough drops, alcohol free Flonase (by prescription only) Guaifenesin Mucinex Robitussin DM (plain only, alcohol free) Saline nasal spray/drops Sudafed (pseudoephedrine) & Actifed ** use only after [redacted] weeks gestation and if you do not have high blood pressure Tylenol Vicks Vaporub Zinc lozenges Zyrtec   Constipation: Colace Ducolax suppositories Fleet enema Glycerin suppositories Metamucil Milk of magnesia Miralax Senokot Smooth move tea  Diarrhea: Kaopectate Imodium A-D  *NO pepto Bismol  Hemorrhoids: Anusol Anusol HC Preparation H Tucks  Indigestion: Tums Maalox Mylanta Zantac  Pepcid  Insomnia: Benadryl (alcohol free) 25mg every 6 hours as needed Tylenol PM Unisom, no Gelcaps  Leg Cramps: Tums MagGel  Nausea/Vomiting:  Bonine Dramamine Emetrol Ginger extract Sea bands Meclizine  Nausea medication to take during pregnancy:  Unisom (doxylamine succinate 25 mg tablets) Take one tablet daily at bedtime. If symptoms are not adequately controlled, the dose can be increased to a maximum recommended dose of two tablets daily (1/2 tablet in the morning, 1/2 tablet mid-afternoon and one at bedtime). Vitamin B6 100mg tablets. Take one tablet twice a day (up to 200 mg per day).  Skin Rashes: Aveeno products Benadryl cream or 25mg every 6 hours as needed Calamine Lotion 1% cortisone cream  Yeast infection: Gyne-lotrimin 7 Monistat 7   **If taking multiple medications, please check labels to avoid duplicating the same active ingredients **take medication as directed on  the label ** Do not exceed 4000 mg of tylenol in 24 hours **Do not take medications that contain aspirin or ibuprofen     Bland Diet A bland diet consists of foods that are often soft and do not have a lot of fat, fiber, or extra seasonings. Foods without fat, fiber, or seasoning are easier for the body to digest. They are also less likely to irritate your mouth, throat, stomach, and other parts of your digestive system. A bland diet is sometimes called a BRAT diet. What is my plan? Your health care provider or food and nutrition specialist (dietitian) may recommend specific changes to your diet to prevent symptoms or to treat your symptoms. These changes may include:  Eating small meals often.  Cooking food until it is soft enough to chew easily.  Chewing your food well.  Drinking fluids slowly.  Not eating foods that are very spicy, sour, or fatty.  Not eating citrus fruits, such as oranges and grapefruit. What do I need to know about this diet?  Eat a variety of foods from the bland diet food list.  Do not follow a bland diet longer than needed.  Ask your health care provider whether you should take vitamins or supplements. What foods can I eat? Grains Hot cereals, such as cream of wheat. Rice. Bread, crackers, or tortillas made from refined white flour.   Vegetables Canned or cooked vegetables. Mashed or boiled potatoes. Fruits Bananas. Applesauce. Other types of cooked or canned fruit with the skin and seeds removed, such as canned peaches or pears.   Meats and other proteins Scrambled eggs. Creamy peanut butter or other nut butters. Lean, well-cooked meats, such as chicken or fish. Tofu. Soups   or broths.   Dairy Low-fat dairy products, such as milk, cottage cheese, or yogurt. Beverages Water. Herbal tea. Apple juice.   Fats and oils Mild salad dressings. Canola or olive oil. Sweets and desserts Pudding. Custard. Fruit gelatin. Ice cream. The items listed above may  not be a complete list of recommended foods and beverages. Contact a dietitian for more options. What foods are not recommended? Grains Whole grain breads and cereals. Vegetables Raw vegetables. Fruits Raw fruits, especially citrus, berries, or dried fruits. Dairy Whole fat dairy foods. Beverages Caffeinated drinks. Alcohol. Seasonings and condiments Strongly flavored seasonings or condiments. Hot sauce. Salsa. Other foods Spicy foods. Fried foods. Sour foods, such as pickled or fermented foods. Foods with high sugar content. Foods high in fiber. The items listed above may not be a complete list of foods and beverages to avoid. Contact a dietitian for more information. Summary  A bland diet consists of foods that are often soft and do not have a lot of fat, fiber, or extra seasonings.  Foods without fat, fiber, or seasoning are easier for the body to digest.  Check with your health care provider to see how long you should follow this diet plan. It is not meant to be followed for long periods. This information is not intended to replace advice given to you by your health care provider. Make sure you discuss any questions you have with your health care provider. Document Revised: 06/29/2017 Document Reviewed: 06/29/2017 Elsevier Patient Education  2021 Elsevier Inc.  

## 2020-11-25 ENCOUNTER — Other Ambulatory Visit: Payer: Self-pay | Admitting: Family Medicine

## 2020-11-25 ENCOUNTER — Ambulatory Visit (INDEPENDENT_AMBULATORY_CARE_PROVIDER_SITE_OTHER): Payer: Medicaid Other | Admitting: Family Medicine

## 2020-11-25 ENCOUNTER — Other Ambulatory Visit: Payer: Self-pay

## 2020-11-25 ENCOUNTER — Other Ambulatory Visit (HOSPITAL_COMMUNITY)
Admission: RE | Admit: 2020-11-25 | Discharge: 2020-11-25 | Disposition: A | Discharge status: Home / Self Care | Payer: Medicaid Other | Source: Ambulatory Visit | Attending: Family Medicine | Admitting: Family Medicine

## 2020-11-25 ENCOUNTER — Encounter: Payer: Self-pay | Admitting: Family Medicine

## 2020-11-25 VITALS — BP 108/70 | HR 111 | Ht 64.5 in | Wt 145.0 lb

## 2020-11-25 DIAGNOSIS — Z3480 Encounter for supervision of other normal pregnancy, unspecified trimester: Secondary | ICD-10-CM

## 2020-11-25 DIAGNOSIS — N760 Acute vaginitis: Secondary | ICD-10-CM | POA: Diagnosis not present

## 2020-11-25 DIAGNOSIS — Z124 Encounter for screening for malignant neoplasm of cervix: Secondary | ICD-10-CM

## 2020-11-25 DIAGNOSIS — Z3482 Encounter for supervision of other normal pregnancy, second trimester: Secondary | ICD-10-CM | POA: Diagnosis not present

## 2020-11-25 DIAGNOSIS — Z5900 Homelessness unspecified: Secondary | ICD-10-CM | POA: Diagnosis not present

## 2020-11-25 DIAGNOSIS — Z113 Encounter for screening for infections with a predominantly sexual mode of transmission: Secondary | ICD-10-CM

## 2020-11-25 DIAGNOSIS — O0932 Supervision of pregnancy with insufficient antenatal care, second trimester: Secondary | ICD-10-CM | POA: Diagnosis not present

## 2020-11-25 DIAGNOSIS — B9689 Other specified bacterial agents as the cause of diseases classified elsewhere: Secondary | ICD-10-CM

## 2020-11-25 LAB — POCT WET PREP (WET MOUNT)
Clue Cells Wet Prep Whiff POC: POSITIVE
Trichomonas Wet Prep HPF POC: ABSENT

## 2020-11-25 MED ORDER — METRONIDAZOLE 0.75 % VA GEL: 1.0000 | Freq: Every day | VAGINAL | 0 refills | Status: AC

## 2020-11-25 NOTE — Patient Instructions (Addendum)
It was great seeing you today!  Please check-out at the front desk before leaving the clinic. I'd like to see you back in 2 weeks but if you need to be seen earlier than that for any new issues we're happy to fit you in, just give [place] a call!  Your anatomy ultrasound is scheduled for 12/18/2020 10:45 AM at  [facility]. She the follow up section for a list of your upcoming appointments.  For your vaginal discharge: You have an overgrowth of a normal bacteria (Bacterial vaginosis). Stop by your pharmacy to pick up your prescriptions.    Regarding lab work today:  Due to recent changes in healthcare laws, you may see the results of your imaging and laboratory studies on MyChart before your provider has had a chance to review them.  I understand that in some cases there may be results that are confusing or concerning to you. Not all laboratory results come back in the same time frame and you may be waiting for multiple results in order to interpret others.  Please give [place] 72 hours in order for your provider to thoroughly review all the results before contacting the office for clarification of your results. If everything is normal, you will get a letter in the mail or a message in My Chart. Please give [place] a call if you do not hear from [place] after 2 weeks.  Please bring all of your medications with you to each visit.    If you haven't already, sign up for My Chart to have easy access to your labs results, and communication with your primary care physician.  Feel free to call with any questions or concerns at any time, at [phone].   Take care,  Dr. Katherina [facility]

## 2020-11-25 NOTE — Progress Notes (Signed)
   SUBJECTIVE:   CHIEF COMPLAINT / HPI:   Chief Complaint  Patient presents with   Vaginal exam   Pt's phone # : [phone]   Kristin Hunt is a 20 y.o. female here for OB care and has vaginal discharge. She is in need of a PAP smear. Pt is [redacted] weeks pregnant. She found out she was pregnant in March. [id number].    Kenlea denies smoking, drinking alcohol and illicit drug use. Previously smoked cigarettes but quit in the beginning of the year. Denies vaginally bleeding, leaking of fluid, vomiting. Intermittent nausea. Reports +fetal movement.   Endorses thick white discharge but not like cottage cheese. Has vaginal itching. No pelvic pain. Discharge has been present for the past month. She has not tried anything for the discharge. Previously had Chlamydia when she was pregnant with her son a couple years ago.   Pt says there is laundry room in an apartment complex. She just got on [facility]. Son's father sends her money. She works at [employer] and can also eat there.     PERTINENT  PMH / PSH: reviewed and updated as appropriate   OBJECTIVE:   BP 108/70   Pulse (!) 111   Ht 5' 4.5" (1.638 m)   Wt 145 lb (65.8 kg)   LMP 06/28/2020   SpO2 98%   BMI 24.50 kg/m    GEN: well appearing female in no acute distress  CVS: well perfused, RRR RESP: no increased work of breathing,  ABD: soft, non-tender, gravid at/slightly above the umbilicus, no palpable masses  Pelvic exam: normal external genitalia, vulva, VAGINA and CERVIX: lesions absent, cervical discharge present - creamy and green, WET MOUNT done - results: KOH done, clue cells, excessive bacteria, positive whiff test, DNA probe for chlamydia and GC obtained, ADNEXA: normal adnexa in size, nontender and no masses, exam chaperoned by CMA Jessica.    ASSESSMENT/PLAN:   BV (bacterial vaginosis) Confirmed on wet prep. G/C Percell Locus is pending. Symptoms consistent with this.  - Treatment: Flagyl gel x 7 days and abstain from coitus during  course of treatment. Advised patient to not drink alcohol while taking this medication.  - F/U if symptoms not improving or getting worse.  - Will f/u on G/C Chlamydia and call in Rx if positive.  - Self care instructions given including avoiding douching.  - Return precautions including abdominal pain, fever, chills, nausea, or vomiting given.    Supervision of normal intrauterine pregnancy in multigravida Pt is 20 yo a G3P1011 at [facility] [redacted]w[redacted]d who presented today for prenatal visit. Pt dated by early ultrasound. Unfortunately, she has not had her initial OB visit and is not [redacted] weeks gestation. PAP today. Treated for BV. GC/CT collected.  Initial prenatal labs drawn. Anatomy ultrasound scheduled. Initial OB appt scheduled. Fetal heart tones appropriate. Follow up as scheduled.   Homeless Discussed social work referral with pt as she has an unsafe housing while pregnant. She is interested in resources. She works at a [employer]. She has access to some food at her job and reports having WIC. Mom seen with her son in office today. Son stays with dad. [employer] advocate Marylu Lund spoke with mom and will follow up. - Referral to [facility] ([facility])   COVID vaccine declined.   Pt to follow up on 6/30 at [facility] and [facility] faculty on 7/14.   Katha Cabal, DO PGY-2, [facility] 11/25/2020

## 2020-11-26 DIAGNOSIS — Z5900 Homelessness unspecified: Secondary | ICD-10-CM | POA: Insufficient documentation

## 2020-11-26 DIAGNOSIS — Z3480 Encounter for supervision of other normal pregnancy, unspecified trimester: Secondary | ICD-10-CM | POA: Insufficient documentation

## 2020-11-26 DIAGNOSIS — O0932 Supervision of pregnancy with insufficient antenatal care, second trimester: Secondary | ICD-10-CM | POA: Insufficient documentation

## 2020-11-26 LAB — OBSTETRIC PANEL, INCLUDING HIV
Antibody Screen: NEGATIVE
Basophils Absolute: 0 10*3/uL (ref 0.0–0.2)
Basos: 1 %
EOS (ABSOLUTE): 0.1 10*3/uL (ref 0.0–0.4)
Eos: 1 %
HIV Screen 4th Generation wRfx: NONREACTIVE
Hematocrit: 37.5 % (ref 34.0–46.6)
Hemoglobin: 12.6 g/dL (ref 11.1–15.9)
Hepatitis B Surface Ag: NEGATIVE
Immature Grans (Abs): 0 10*3/uL (ref 0.0–0.1)
Immature Granulocytes: 0 %
Lymphocytes Absolute: 1.7 10*3/uL (ref 0.7–3.1)
Lymphs: 20 %
MCH: 28.2 pg (ref 26.6–33.0)
MCHC: 33.6 g/dL (ref 31.5–35.7)
MCV: 84 fL (ref 79–97)
Monocytes Absolute: 0.5 10*3/uL (ref 0.1–0.9)
Monocytes: 6 %
Neutrophils Absolute: 6.4 10*3/uL (ref 1.4–7.0)
Neutrophils: 72 %
Platelets: 336 10*3/uL (ref 150–450)
RBC: 4.47 x10E6/uL (ref 3.77–5.28)
RDW: 13.4 % (ref 11.7–15.4)
RPR Ser Ql: NONREACTIVE
Rh Factor: POSITIVE
Rubella Antibodies, IGG: 1.62 index (ref >0.99)
WBC: 8.8 10*3/uL (ref 3.4–10.8)

## 2020-11-26 LAB — HCV AB W REFLEX TO QUANT PCR: HCV Ab: 0.4 s/co ratio (ref 0.0–0.9)

## 2020-11-26 LAB — CERVICOVAGINAL ANCILLARY ONLY
Chlamydia: NEGATIVE
Comment: NEGATIVE
Comment: NORMAL
Neisseria Gonorrhea: NEGATIVE

## 2020-11-26 LAB — CYTOLOGY - PAP: Diagnosis: NEGATIVE

## 2020-11-26 LAB — HCV INTERPRETATION

## 2020-11-26 NOTE — Assessment & Plan Note (Addendum)
Confirmed on wet prep. G/C Percell Locus is pending. Symptoms consistent with this.  - Treatment: Flagyl gel x 7 days and abstain from coitus during course of treatment. Advised patient to not drink alcohol while taking this medication.  - F/U if symptoms not improving or getting worse.  - Will f/u on G/C Chlamydia and call in Rx if positive.  - Self care instructions given including avoiding douching.  - Return precautions including abdominal pain, fever, chills, nausea, or vomiting given.

## 2020-11-26 NOTE — Assessment & Plan Note (Addendum)
Pt is 20 yo a G3P1011 at [facility] [redacted]w[redacted]d who presented today for prenatal visit. Pt dated by early ultrasound. Unfortunately, she has not had her initial OB visit and is not [redacted] weeks gestation. PAP today. Treated for BV. GC/CT collected.  Initial prenatal labs drawn. Anatomy ultrasound scheduled. Initial OB appt scheduled. Fetal heart tones appropriate. Follow up as scheduled.

## 2020-11-26 NOTE — Assessment & Plan Note (Signed)
Discussed social work referral with pt as she has an unsafe housing while pregnant. She is interested in resources. She works at a [employer]. She has access to some food at her job and reports having WIC. Mom seen with her son in office today. Son stays with dad. [employer] advocate Marylu Lund spoke with mom and will follow up. - Referral to [facility] Management (CCM)

## 2020-11-27 ENCOUNTER — Telehealth: Payer: Self-pay | Admitting: *Deleted

## 2020-11-27 LAB — CULTURE, OB URINE

## 2020-11-27 LAB — URINE CULTURE, OB REFLEX

## 2020-11-27 NOTE — Telephone Encounter (Signed)
Telephone encounter was:  Unsuccessful.  11/27/2020 Name: Kristin Hunt MRN: [id number] DOB: [birth year]  Unsuccessful outbound call made today to assist with:  Transportation Needs  and housing  Outreach Attempt:  1st Attempt Unable to leave message no voicemail [name] -[facility] Guide , Embedded Care Coordination [name], Care Management  [phone] [address] [place] , [place] [address] Email : [name]

## 2020-12-01 ENCOUNTER — Telehealth: Payer: Self-pay | Admitting: *Deleted

## 2020-12-01 NOTE — Telephone Encounter (Signed)
   Telephone encounter was:  Unsuccessful.  12/01/2020 Name: Kristin Hunt MRN: [id number] DOB: 10-08-2000  Unsuccessful outbound call made today to assist with:  Home Modifications  Outreach Attempt:  2nd Attempt No Answer/Busy   Alois Cliche -[facility] Guide , Embedded Care Coordination [facility], Care Management  [phone] [address] [place] , [place] [address] Email : [name]

## 2020-12-02 ENCOUNTER — Telehealth: Payer: Self-pay | Admitting: *Deleted

## 2020-12-02 NOTE — Telephone Encounter (Signed)
   Telephone encounter was:  Unsuccessful.  12/02/2020 Name: Reeanna Acri MRN: [id number] DOB: Jul 07, 2000  Unsuccessful outbound call made today to assist with:  Transportation Needs  and Food Insecurity  Outreach Attempt:  3rd Attempt.  Referral closed unable to contact patient.      Yehuda Mao Greenauer -[facility] Guide , Embedded Care Coordination [facility], Care Management  [phone] [address] [place] , [place] [address] Email : [name]

## 2020-12-03 ENCOUNTER — Inpatient Hospital Stay (HOSPITAL_COMMUNITY)
Admission: AD | Admit: 2020-12-03 | Discharge: 2020-12-03 | Disposition: A | Discharge status: Home / Self Care | Payer: Medicaid Other | Attending: Obstetrics & Gynecology | Admitting: Obstetrics & Gynecology

## 2020-12-03 ENCOUNTER — Telehealth: Payer: Self-pay | Admitting: Family Medicine

## 2020-12-03 DIAGNOSIS — Z79899 Other long term (current) drug therapy: Secondary | ICD-10-CM | POA: Insufficient documentation

## 2020-12-03 DIAGNOSIS — Z3201 Encounter for pregnancy test, result positive: Secondary | ICD-10-CM

## 2020-12-03 DIAGNOSIS — Z3A23 23 weeks gestation of pregnancy: Secondary | ICD-10-CM | POA: Insufficient documentation

## 2020-12-03 DIAGNOSIS — O99332 Smoking (tobacco) complicating pregnancy, second trimester: Secondary | ICD-10-CM | POA: Insufficient documentation

## 2020-12-03 DIAGNOSIS — F17210 Nicotine dependence, cigarettes, uncomplicated: Secondary | ICD-10-CM | POA: Diagnosis not present

## 2020-12-03 NOTE — MAU Note (Signed)
Just got into "room at the inn" program, needs proof of prenancy. Denies pain, bleeding.  Is feeling baby move.

## 2020-12-03 NOTE — MAU Provider Note (Signed)
Chief Complaint: proof of pregnancy   Event Date/Time   First Provider Initiated Contact with Patient 12/03/20 1741      SUBJECTIVE HPI: Kristin Hunt is a 20 y.o. G3P1011 who presents to maternity admissions for a pregnancy verification letter. She is moving into room at the [facility] and she is required to show proof that she is pregnant.   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[facility])    Past Surgical History:  Procedure Laterality Date   WISDOM TOOTH EXTRACTION     2018   Social History   Socioeconomic History   Marital status: Single    Spouse name: Not on file   Number of children: Not on file   Years of education: Not on file   Highest education level: Not on file  Occupational History   Not on file  Tobacco Use   Smoking status: Light Smoker    Pack years: 0.00    Types: Cigarettes   Smokeless tobacco: Never   Tobacco comments:    smoked two weeks ago  Vaping Use   Vaping Use: Never used  Substance and Sexual Activity   Alcohol use: No   Drug use: No   Sexual activity: Not Currently  Other Topics Concern   Not on file  Social History Narrative   Lives at home with brother, father and step mom. Moved to [place] from [place] in August 2017. 2 dogs in the home. Both parents smoke in the home.    Social Determinants of Health   Financial Resource Strain: Not on file  Food Insecurity: Not on file  Transportation Needs: Not on file  Physical Activity: Not on file  Stress: Not on file  Social Connections: Not on file  Intimate Partner Violence: Not on file   No current facility-administered medications on file prior to encounter.   Current Outpatient Medications on File Prior to Encounter  Medication Sig Dispense Refill   famotidine (PEPCID) 20 MG tablet Take 1 tablet (20 mg total) by mouth daily. 30 tablet 1   metoCLOPramide (REGLAN) 10 MG tablet Take 1 tablet (10 mg total) by mouth every 8 (eight) hours as needed for nausea. 30  tablet 0   ondansetron (ZOFRAN ODT) 4 MG disintegrating tablet Take 1 tablet (4 mg total) by mouth every 8 (eight) hours as needed for nausea or vomiting. 30 tablet 2   scopolamine (TRANSDERM-SCOP) 1 MG/3DAYS Place 1 patch (1.5 mg total) onto the skin every 3 (three) days. 10 patch 1   No Known Allergies  ROS:  Review of Systems  Gastrointestinal:  Negative for abdominal pain.  Genitourinary:  Negative for vaginal bleeding.   I have reviewed patient's Past Medical Hx, Surgical Hx, Family Hx, Social Hx, medications and allergies.   Physical Exam  Patient Vitals for the past 24 hrs:  BP Temp Temp src Pulse Resp SpO2 Height Weight  12/03/20 1729 111/63 98.5 F (36.9 C) Oral 95 16 99 % 5\' 4"  (1.626 m) 69.3 kg   Physical Exam Vitals and nursing note reviewed.  Constitutional:      Appearance: Normal appearance.  HENT:     Head: Normocephalic.  Neurological:     Mental Status: She is alert and oriented to person, place, and time.  Psychiatric:        Behavior: Behavior normal.    MDM Patient denies any concerning symptoms in need of emergent evaluation.   ASSESSMENT MSE Complete Proof of pregnancy provided.  PLAN Discharge patient  Return to MAU for emergency symptoms.   Duane Lope, NP 12/03/2020 6:57 PM

## 2020-12-04 NOTE — Telephone Encounter (Signed)
Opened in error

## 2020-12-11 ENCOUNTER — Ambulatory Visit (INDEPENDENT_AMBULATORY_CARE_PROVIDER_SITE_OTHER): Payer: Medicaid Other | Admitting: Family Medicine

## 2020-12-11 ENCOUNTER — Other Ambulatory Visit (HOSPITAL_COMMUNITY)
Admission: RE | Admit: 2020-12-11 | Discharge: 2020-12-11 | Disposition: A | Discharge status: Home / Self Care | Payer: Medicaid Other | Source: Ambulatory Visit | Attending: Family Medicine | Admitting: Family Medicine

## 2020-12-11 ENCOUNTER — Other Ambulatory Visit: Payer: Self-pay

## 2020-12-11 VITALS — BP 116/60 | HR 85 | Wt 151.6 lb

## 2020-12-11 DIAGNOSIS — N898 Other specified noninflammatory disorders of vagina: Secondary | ICD-10-CM | POA: Insufficient documentation

## 2020-12-11 DIAGNOSIS — Z3482 Encounter for supervision of other normal pregnancy, second trimester: Secondary | ICD-10-CM

## 2020-12-11 MED ORDER — ASPIRIN EC 81 MG PO TBEC: 81.0000 mg | DELAYED_RELEASE_TABLET | Freq: Every day | ORAL | 11 refills | Status: DC

## 2020-12-11 MED ORDER — CLOTRIMAZOLE 1 % VA CREA: 1.0000 | TOPICAL_CREAM | Freq: Every day | VAGINAL | 0 refills | Status: AC

## 2020-12-11 NOTE — Patient Instructions (Signed)
It was wonderful to see you today.  Please bring ALL of your medications with you to every visit.   Today we talked about:  I am sending in a prescription for aspirin for you to take daily.  This will help decrease your risk of developing preeclampsia.  We did a swab today to check for sexually transmitted infections, bacterial vaginosis, and yeast infections.  I will let you know the results via MyChart and we will treat as necessary.  Glad that you are doing well.  Your baby sounds great.  Do not forget that your anatomy ultrasound is scheduled for 7/7.  If you experience any vaginal bleeding, leakage of fluids, don't feel your baby moving as much, or start to have contractions less than 5 minutes apart please go directly to the Maternal Assessment Unit at [facility] for evaluation.  Maternity and women's care services located on the [place] side of [facility] [place]. [facility] (Entrance C off [address]).  [address] Entr[address] [place],  [place]  [address]      Thank you for choosing [facility].   Please call [phone] with any questions about today's appointment.  Please be sure to schedule follow up at the front  desk before you leave today.   Sabino Dick, DO PGY-1 Family Medicine

## 2020-12-11 NOTE — Progress Notes (Signed)
Patient Name: Kristin Hunt Date of Birth: 01/02/2001 [facility] Initial Prenatal Visit  Kristin Hunt is a 20 y.o. year old G3P1011 at [redacted]w[redacted]d who presents for her initial prenatal visit. Pregnancy is not planned but happy with it. She reports  white vaginal discharge that started right after antibiotics 5-6 days ago . She is taking a prenatal vitamin.  She denies pelvic pain or vaginal bleeding.   Pregnancy Dating: The patient is dated by LMP. Early ultrasound at [redacted]w[redacted]d without greater than 5 day difference from LMP.  LMP: Last 06/28/20, [facility] 04/04/21 Period is certain:  Yes.  Periods were regular:  Yes.  LMP was a typical period:  Yes.  Using hormonal contraception in 3 months prior to conception: No  Lab Review: Blood type: B Rh Status: + Antibody screen: Negative HIV: Negative RPR: Negative Hemoglobin electrophoresis reviewed: No, was not performed. Has had previous negative sickle cell screen.  Results of OB urine culture are: Negative Rubella: Immune Hep C Ab: Negative Varicella status is Not immune  PMH: Reviewed and as detailed below: HTN: No  Gestational Hypertension/preeclampsia: No  Type 1 or 2 Diabetes: No  Depression:  No  Seizure disorder:  Yes, states had pseudoseizures at the beginning of last pregnancy- only during first trimester.  VTE: No ,  History of STI Yes, chlamydia Abnormal Pap smear:  No, Genital herpes simplex:  No   PSH: Gynecologic Surgery:  no Surgical history reviewed, notable for: None  Obstetric History: Obstetric history tab updated and reviewed.  Summary of prior pregnancies: 1 previous term pregnancy on 01/04/18 to a female at [redacted]w[redacted]d. Required vacuum extraction for prolonged second stage and maternal exhaustion. Also with shoulder dystocia, relieved by McRoberts and suprapubic pressure. APGAR's 5, 7 at 1 and 5 minutes respectively.  Cesarean delivery: No  Gestational Diabetes:  No Hypertension in pregnancy: No History of  preterm birth: No History of LGA/SGA infant:  No History of shoulder dystocia: Yes Indications for referral were reviewed, and the patient has no obstetric indications for referral to High Risk OB Clinic at this time.   Social History: Partner's name: Jarrett Soho  Tobacco use: Yes, just had one puff about 3 weeks ago.  Alcohol use:  No Other substance use:  No  Current Medications:  OTC Prenatal with iron  OTC Tylenol Reviewed and appropriate in pregnancy.   Genetic and Infection Screen: Flow Sheet Updated Yes  Prenatal Exam: Gen: Well nourished, well developed.  No distress.  Vitals noted. Lungs: Breathing comfortably on room air, no respiratory distress, speaking in complete sentences Abd: soft, NTND. Gravid GU: Normal external female genitalia without lesions.  Nl vaginal, well rugated without lesions. Large amount of white, clumpy discharge seen on vulva and intravaginally.  Ext: No clubbing, cyanosis or edema. Psych: Normal grooming and dress.  Not depressed or anxious appearing.  Normal thought content and process without flight of ideas or looseness of associations  Fetal heart tones: Appropriate, 145 bpm  Assessment/Plan:  Kristin Hunt is a 20 y.o. G3P1011 at [redacted]w[redacted]d who presents to initiate prenatal care. She is doing well.  Current pregnancy issues include Dental pain- is seeing dentist next week.  Routine prenatal care: As dating is reliable, a dating ultrasound has not been ordered. Dating tab updated. Pre-pregnancy weight updated. Expected weight gain this pregnancy is 15-25 pounds Prenatal labs reviewed, notable for rubella immune status, negative STI screen. Indications for referral to HROB were reviewed and the patient does not meet criteria for referral.  Medication list reviewed and updated.  Recommended patient see a dentist for regular care.  Bleeding and pain precautions reviewed. Importance of prenatal vitamins reviewed.  Genetic screening offered.  Patient opted for: no screening, too late. The patient does have an indication for aspirin therapy beginning at 12-16 weeks. Aspirin was  recommended today.  The patient will not be age 73 or over at time of delivery. Referral to genetic counseling was not offered today.  The patient has the following risk factors for preexisting diabetes: Reviewed indications for early 1 hour glucose testing, not indicated as patient is already in second trimester. An early 1 hour glucose tolerance test was not ordered. Pregnancy Medical Home and PHQ-9 forms completed, problems noted: Yes  2. Pregnancy issues include the following which were addressed today:  Anatomy scan scheduled for 7/7.  Homelessness: Lives at the room at the end.  She has been connected with assistance with transportation. Varicella non-immune: will need post-partum vaccination.  Started on ASA 81 mg daily for Pre-E prevention, though she is presenting later in 2nd trimester. Vaginal discharge: Most likely Candida given presentation. Treating empirically with clotrimazole 1% vaginal cream x7 days. STI screening also performed.  Tooth pain: Has appointment with dentist already scheduled.  Letter provided and given to patient for this appointment.  Taking Tylenol for the pain. Late to prenatal care.   Follow up 4 weeks for next prenatal visit.

## 2020-12-18 ENCOUNTER — Ambulatory Visit: Payer: Medicaid Other | Attending: Family Medicine

## 2020-12-18 ENCOUNTER — Other Ambulatory Visit: Payer: Self-pay | Admitting: Family Medicine

## 2020-12-18 ENCOUNTER — Other Ambulatory Visit: Payer: Self-pay

## 2020-12-18 DIAGNOSIS — Z3A24 24 weeks gestation of pregnancy: Secondary | ICD-10-CM

## 2020-12-18 DIAGNOSIS — Z3689 Encounter for other specified antenatal screening: Secondary | ICD-10-CM | POA: Insufficient documentation

## 2020-12-18 LAB — CERVICOVAGINAL ANCILLARY ONLY
Bacterial Vaginitis (gardnerella): NEGATIVE
Candida Glabrata: NEGATIVE
Candida Vaginitis: NEGATIVE
Chlamydia: NEGATIVE
Comment: NEGATIVE
Comment: NEGATIVE
Comment: NEGATIVE
Comment: NEGATIVE
Comment: NEGATIVE
Comment: NORMAL
Neisseria Gonorrhea: NEGATIVE
Trichomonas: NEGATIVE

## 2020-12-25 ENCOUNTER — Other Ambulatory Visit: Payer: Self-pay | Admitting: *Deleted

## 2020-12-25 DIAGNOSIS — Z362 Encounter for other antenatal screening follow-up: Secondary | ICD-10-CM

## 2021-01-07 ENCOUNTER — Encounter: Payer: Medicaid Other | Admitting: Family Medicine

## 2021-01-15 ENCOUNTER — Other Ambulatory Visit: Payer: Self-pay

## 2021-01-15 ENCOUNTER — Encounter: Payer: Self-pay | Admitting: *Deleted

## 2021-01-15 ENCOUNTER — Ambulatory Visit: Payer: Medicaid Other | Attending: Obstetrics and Gynecology

## 2021-01-15 ENCOUNTER — Ambulatory Visit: Payer: Medicaid Other | Admitting: *Deleted

## 2021-01-15 VITALS — BP 110/63 | HR 85

## 2021-01-15 DIAGNOSIS — O99333 Smoking (tobacco) complicating pregnancy, third trimester: Secondary | ICD-10-CM | POA: Diagnosis not present

## 2021-01-15 DIAGNOSIS — Z362 Encounter for other antenatal screening follow-up: Secondary | ICD-10-CM | POA: Insufficient documentation

## 2021-01-15 DIAGNOSIS — F17210 Nicotine dependence, cigarettes, uncomplicated: Secondary | ICD-10-CM | POA: Diagnosis not present

## 2021-01-15 DIAGNOSIS — Z3A28 28 weeks gestation of pregnancy: Secondary | ICD-10-CM

## 2021-01-22 ENCOUNTER — Other Ambulatory Visit: Payer: Self-pay

## 2021-01-22 ENCOUNTER — Ambulatory Visit (INDEPENDENT_AMBULATORY_CARE_PROVIDER_SITE_OTHER): Payer: Medicaid Other

## 2021-01-22 ENCOUNTER — Ambulatory Visit (INDEPENDENT_AMBULATORY_CARE_PROVIDER_SITE_OTHER): Payer: Medicaid Other | Admitting: Family Medicine

## 2021-01-22 VITALS — BP 110/69 | HR 81 | Wt 160.4 lb

## 2021-01-22 DIAGNOSIS — Z23 Encounter for immunization: Secondary | ICD-10-CM

## 2021-01-22 DIAGNOSIS — Z8759 Personal history of other complications of pregnancy, childbirth and the puerperium: Secondary | ICD-10-CM | POA: Insufficient documentation

## 2021-01-22 DIAGNOSIS — Z3482 Encounter for supervision of other normal pregnancy, second trimester: Secondary | ICD-10-CM | POA: Diagnosis present

## 2021-01-22 DIAGNOSIS — O09293 Supervision of pregnancy with other poor reproductive or obstetric history, third trimester: Secondary | ICD-10-CM | POA: Diagnosis not present

## 2021-01-22 DIAGNOSIS — O09299 Supervision of pregnancy with other poor reproductive or obstetric history, unspecified trimester: Secondary | ICD-10-CM | POA: Insufficient documentation

## 2021-01-22 LAB — POCT 1 HR PRENATAL GLUCOSE: Glucose 1 Hr Prenatal, POC: 97 mg/dL

## 2021-01-22 NOTE — Progress Notes (Signed)
   Covid-19 Vaccination Clinic  Name:  Kristin Hunt    MRN: [id number] DOB: 12-07-2000  01/22/2021  [name] was observed post Covid-19 immunization for 15 minutes without incident. She was provided with Vaccine Information Sheet and instruction to access the V-Safe system.   [name] was instructed to call 911 with any severe reactions post vaccine: Difficulty breathing  Swelling of face and throat  A fast heartbeat  A bad rash all over body  Dizziness and weakness

## 2021-01-22 NOTE — Patient Instructions (Addendum)
Go to the MAU at [facility] at [facility] if: You have cramping/contractions that do not go away with drinking water Your water breaks.  Sometimes it is a big gush of fluid, sometimes it is just a trickle that keeps getting your underwear wet or running down your legs You have vaginal bleeding.    You do not feel your baby moving like normal.  If you do not, get something to eat and drink and lay down and focus on feeling your baby move. If your baby is still not moving like normal, you should go to MAU.   Be well, Dr. Pollie Meyer   Third Trimester of Pregnancy  The third trimester of pregnancy is from week 28 through week 40. This is months 7 through 9. The third trimester is a time when the unborn baby (fetus) is growing rapidly. At the end of the ninth month, the fetus is about 20inches long and weighs 6-10 pounds. Body changes during your third trimester During the third trimester, your body will continue to go through many changes.The changes vary and generally return to normal after your baby is born. Physical changes Your weight will continue to increase. You can expect to gain 25-35 pounds (11-16 kg) by the end of the pregnancy if you begin pregnancy at a normal weight. If you are underweight, you can expect to gain 28-40 lb (about 13-18 kg), and if you are overweight, you can expect to gain 15-25 lb (about 7-11 kg). You may begin to get stretch marks on your hips, abdomen, and breasts. Your breasts will continue to grow and may hurt. A yellow fluid (colostrum) may leak from your breasts. This is the first milk you are producing for your baby. You may have changes in your hair. These can include thickening of your hair, rapid growth, and changes in texture. Some people also have hair loss during or after pregnancy, or hair that feels dry or thin. Your belly button may stick out. You may notice more swelling in your hands, face, or ankles. Health changes You may have  heartburn. You may have constipation. You may develop hemorrhoids. You may develop swollen, bulging veins (varicose veins) in your legs. You may have increased body aches in the pelvis, back, or thighs. This is due to weight gain and increased hormones that are relaxing your joints. You may have increased tingling or numbness in your hands, arms, and legs. The skin on your abdomen may also feel numb. You may feel short of breath because of your expanding uterus. Other changes You may urinate more often because the fetus is moving lower into your pelvis and pressing on your bladder. You may have more problems sleeping. This may be caused by the size of your abdomen, an increased need to urinate, and an increase in your body's metabolism. You may notice the fetus "dropping," or moving lower in your abdomen (lightening). You may have increased vaginal discharge. You may notice that you have pain around your pelvic bone as your uterus distends. Follow these instructions at home: Medicines Follow your health care provider's instructions regarding medicine use. Specific medicines may be either safe or unsafe to take during pregnancy. Do not take any medicines unless approved by your health care provider. Take a prenatal vitamin that contains at least 600 micrograms (mcg) of folic acid. Eating and drinking Eat a healthy diet that includes fresh fruits and vegetables, whole grains, good sources of protein such as meat, eggs, or tofu, and  low-fat dairy products. Avoid raw meat and unpasteurized juice, milk, and cheese. These carry germs that can harm you and your baby. Eat 4 or 5 small meals rather than 3 large meals a day. You may need to take these actions to prevent or treat constipation: Drink enough fluid to keep your urine pale yellow. Eat foods that are high in fiber, such as beans, whole grains, and fresh fruits and vegetables. Limit foods that are high in fat and processed sugars, such as  fried or sweet foods. Activity Exercise only as directed by your health care provider. Most people can continue their usual exercise routine during pregnancy. Try to exercise for 30 minutes at least 5 days a week. Stop exercising if you experience contractions in the uterus. Stop exercising if you develop pain or cramping in the lower abdomen or lower back. Avoid heavy lifting. Do not exercise if it is very hot or humid or if you are at a high altitude. If you choose to, you may continue to have sex unless your health care provider tells you not to. Relieving pain and discomfort Take frequent breaks and rest with your legs raised (elevated) if you have leg cramps or low back pain. Take warm sitz baths to soothe any pain or discomfort caused by hemorrhoids. Use hemorrhoid cream if your health care provider approves. Wear a supportive bra to prevent discomfort from breast tenderness. If you develop varicose veins: Wear support hose as told by your health care provider. Elevate your feet for 15 minutes, 3-4 times a day. Limit salt in your diet. Safety Talk to your health care provider before traveling far distances. Do not use hot tubs, steam rooms, or saunas. Wear your seat belt at all times when driving or riding in a car. Talk with your health care provider if someone is verbally or physically abusive to you. Preparing for birth To prepare for the arrival of your baby: Take prenatal classes to understand, practice, and ask questions about labor and delivery. Visit the hospital and tour the maternity area. Purchase a rear-facing car seat and make sure you know how to install it in your car. Prepare the baby's room or sleeping area. Make sure to remove all pillows and stuffed animals from the baby's crib to prevent suffocation. General instructions Avoid cat litter boxes and soil used by cats. These carry germs that can cause birth defects in the baby. If you have a cat, ask someone to clean  the litter box for you. Do not douche or use tampons. Do not use scented sanitary pads. Do not use any products that contain nicotine or tobacco, such as cigarettes, e-cigarettes, and chewing tobacco. If you need help quitting, ask your health care provider. Do not use any herbal remedies, illegal drugs, or medicines that were not prescribed to you. Chemicals in these products can harm your baby. Do not drink alcohol. You will have more frequent prenatal exams during the third trimester. During a routine prenatal visit, your health care provider will do a physical exam, perform tests, and discuss your overall health. Keep all follow-up visits. This is important. Where to find more information American Pregnancy Association: americanpregnancy.org [employer] of Obstetricians and Gynecologists: https://www.todd-brady.net/ Office on [employer] Health: MightyReward.co.nz Contact a health care provider if you have: A fever. Mild pelvic cramps, pelvic pressure, or nagging pain in your abdominal area or lower back. Vomiting or diarrhea. Bad-smelling vaginal discharge or foul-smelling urine. Pain when you urinate. A headache that does not go  away when you take medicine. Visual changes or see spots in front of your eyes. Get help right away if: Your water breaks. You have regular contractions less than 5 minutes apart. You have spotting or bleeding from your vagina. You have severe abdominal pain. You have difficulty breathing. You have chest pain. You have fainting spells. You have not felt your baby move for the time period told by your health care provider. You have new or increased pain, swelling, or redness in an arm or leg. Summary The third trimester of pregnancy is from week 28 through week 40 (months 7 through 9). You may have more problems sleeping. This can be caused by the size of your abdomen, an increased need to urinate, and an increase in your body's  metabolism. You will have more frequent prenatal exams during the third trimester. Keep all follow-up visits. This is important. This information is not intended to replace advice given to you by your health care provider. Make sure you discuss any questions you have with your healthcare provider. Document Revised: 11/07/2019 Document Reviewed: 09/13/2019 Elsevier Patient Education  2022 [employer].

## 2021-01-22 NOTE — Progress Notes (Signed)
[facility] Visit  Kristin Hunt is a 20 y.o. G3P1011 at [redacted]w[redacted]d (via LMP=8w u/s) who presents to [facility] for routine follow up. Prenatal course, history, notes, ultrasounds, and laboratory results reviewed.  Denies cramping/ctx, fluid leaking, vaginal bleeding, or decreased fetal movement. Taking PNV and aspirin 81mg  daily.  Primary Prenatal Care Provider: Dr.  Postpartum Plans: - delivery planning: plans for vaginal delivery - circumcision: yes - feeding: breast - BF first child for 6 months  - pediatrician: [facility] - contraception: depo  FHR: 130bpm Uterine size: 29cm  Assessment & Plan  1. Routine prenatal care: - hemoglobinopathy evaluation drawn today - varicella titers drawn today since no known history of vaccine or disease - Tdap and first dose of COVID vaccine given today - 1hr GTT done today, normal result - 28 week labs drawn today: CBC, HIV, RPR  2. History of second trimester pregnancy loss - clarified history of SAB with patient today, and she endorses having an SAB at around [redacted]wks gestation on Apr 05, 2020. She received care for this loss in [place] but does not recall the name of the clinic or hospital. Denies ever being told the cause for the miscarriage. No records available as patient does not recall location she received care. - typically with history of second trimester loss we would refer to HROB, but as patient is now into third trimester unclear whether there is utility to this. Will message Dr. [place] to clarify. Notably cervical length 4.85cm at [redacted]w[redacted]d anatomy ultrasound.   3. History of VAVD with shoulder dystocia in 2019 - baby born at [redacted]w[redacted]d, weighed 7lb 10.9oz (3484g) - required vacuum assisted delivery due to maternal exhaustion after pushing x3 hours - then had shoulder dystocia relieved by McRoberts and suprapubic pressure, APGARS 3 and 7 - suspect patient would benefit from IOL at 39w given  this history, will discuss with Dr. [redacted]w[redacted]d as well.  4. Social constraints: has had late/limited prenatal care, and housing instability - patient moving into pregnancy housing, provided pregnancy confirmation letter - recommend social work consult after delivery for additional resources  Next prenatal visit in 2 weeks. Labor & fetal movement precautions discussed.  Shawnie Pons, MD [facility] Faculty

## 2021-01-26 LAB — CBC
Hematocrit: 32.3 % — ABNORMAL LOW (ref 34.0–46.6)
Hemoglobin: 10.4 g/dL — ABNORMAL LOW (ref 11.1–15.9)
MCH: 27.4 pg (ref 26.6–33.0)
MCHC: 32.2 g/dL (ref 31.5–35.7)
MCV: 85 fL (ref 79–97)
Platelets: 269 10*3/uL (ref 150–450)
RBC: 3.79 x10E6/uL (ref 3.77–5.28)
RDW: 12.6 % (ref 11.7–15.4)
WBC: 6.8 10*3/uL (ref 3.4–10.8)

## 2021-01-26 LAB — HGB FRACTIONATION CASCADE
Hgb A2: 2.4 % (ref 1.8–3.2)
Hgb A: 97.6 % (ref 96.4–98.8)
Hgb F: 0 % (ref 0.0–2.0)
Hgb S: 0 %

## 2021-01-26 LAB — HIV ANTIBODY (ROUTINE TESTING W REFLEX): HIV Screen 4th Generation wRfx: NONREACTIVE

## 2021-01-26 LAB — VARICELLA ZOSTER ANTIBODY, IGG: Varicella zoster IgG: <135 index — ABNORMAL LOW (ref >165)

## 2021-01-26 LAB — RPR: RPR Ser Ql: NONREACTIVE

## 2021-01-28 ENCOUNTER — Telehealth: Payer: Self-pay | Admitting: Family Medicine

## 2021-01-28 ENCOUNTER — Encounter: Payer: Self-pay | Admitting: Family Medicine

## 2021-01-28 DIAGNOSIS — Z2839 Other underimmunization status: Secondary | ICD-10-CM | POA: Insufficient documentation

## 2021-01-28 DIAGNOSIS — O09893 Supervision of other high risk pregnancies, third trimester: Secondary | ICD-10-CM | POA: Insufficient documentation

## 2021-01-28 MED ORDER — IRON (FERROUS SULFATE) 325 (65 FE) MG PO TABS: 1.0000 | ORAL_TABLET | ORAL | 2 refills | Status: DC

## 2021-01-28 NOTE — Telephone Encounter (Signed)
Attempted to reach patient to discuss several items, no answer. LVM asking her to call back.  Items I wanted to discuss: I spoke with Dr. Shawnie Pons, no need for HROB referral. She does think IOL at 39w is reasonable.  She is non-immune to varicella. Will need postpartum varicella vaccination - cannot be given during pregnancy Has mild anemia on her labs. Recommend she takes iron tablet every other day. I have sent this in for her.  I am out of the office the rest of this week. I will send her a mychart message with these details as well, and am happy to talk to her next week when I return. Dr. Deirdre Priest is covering for me the rest of the week.  Latrelle Dodrill, MD

## 2021-02-09 ENCOUNTER — Encounter: Payer: Medicaid Other | Admitting: Family Medicine

## 2021-02-09 NOTE — Progress Notes (Deleted)
   SUBJECTIVE:   CHIEF COMPLAINT / HPI:   No chief complaint on file.    Zari Cly is a 20 y.o. female here for ***   Pt reports ***    PERTINENT  PMH / PSH: reviewed and updated as appropriate   OBJECTIVE:   LMP 06/28/2020   ***  ASSESSMENT/PLAN:   No problem-specific Assessment & Plan notes found for this encounter.     Katha Cabal, DO PGY-3, [facility] 02/09/2021      {    This will disappear when note is signed, click to select method of visit    :1}

## 2021-02-10 ENCOUNTER — Other Ambulatory Visit: Payer: Self-pay

## 2021-02-10 ENCOUNTER — Ambulatory Visit (INDEPENDENT_AMBULATORY_CARE_PROVIDER_SITE_OTHER): Payer: Medicaid Other | Admitting: Family Medicine

## 2021-02-10 VITALS — BP 108/62 | HR 78 | Wt 162.8 lb

## 2021-02-10 DIAGNOSIS — Z3493 Encounter for supervision of normal pregnancy, unspecified, third trimester: Secondary | ICD-10-CM

## 2021-02-10 NOTE — Patient Instructions (Signed)
It was great seeing you today!  I am glad you are doing well! Please continue to take your prenatal vitamin and iron supplementation.   Go to the MAU at [facility] if: You begin to have strong, frequent contractions Your water breaks.  Sometimes it is a big gush of fluid, sometimes it is just a trickle that keeps getting your underwear wet or running down your legs You have vaginal bleeding.  It is normal to have a small amount of spotting if your cervix was checked.  You do not feel your baby moving like normal.  If you do not, get something to eat and drink and lay down and focus on feeling your baby move.   If your baby is still not moving like normal, you should go to MAU.   Please follow up at your next scheduled appointment next week, if anything arises between now and then, please don't hesitate to contact our office.   Thank you for allowing [place] to be a part of your medical care!  Thank you, Dr. Robyne Peers

## 2021-02-10 NOTE — Progress Notes (Signed)
  [facility] Prenatal Visit  Kristin Hunt is a 20 y.o. G3P1011 at [redacted]w[redacted]d here for routine follow up. She is dated by LMP.  She reports no complaints.  She reports fetal movement. She denies vaginal bleeding, contractions, or loss of fluid.  See flow sheet for details.  Vitals:   02/10/21 0943  BP: 108/62  Pulse: 78     A/P: Pregnancy at [redacted]w[redacted]d.  Doing well.   Routine prenatal care:  Dating reviewed, dating tab is correct. Fetal heart tones: 139 Fundal height: 31 The patient does not have a history of HSV and valacyclovir is not indicated at this time.  The patient does not have a history of Cesarean delivery and no referral to [facility] is indicated Infant feeding choice: Exclusively breastfeeding  Contraception choice: Depo injection  Infant circumcision desired yes Influenza vaccine was not given as it is not influenza season.  Tdap was given 01/22/2021. COVID vaccination: 1st dose given on 8/11 and 2nd dose to be administered on  02/12/2021.  Childbirth and education classes were offered. Pregnancy education regarding benefits of breastfeeding, contraception, fetal growth, expected weight gain, and safe infant sleep were discussed.  Preterm labor and fetal movement precautions reviewed.   2. Pregnancy issues include the following and were addressed as appropriate today:  Discussed importance of continued compliance of iron supplementation although patient asymptomatic from anemia. Discussed benefits of breastfeeding. Will need varicella vaccine after delivery. Work note provided with work restrictions described. Per Dr. Valorie Roosevelt previous note, patient not high risk and can continue to receive prenatal care at our clinic but consider induction at 39 weeks given vacuum assisted delivery with previous pregnancy.   Problem list and pregnancy box updated: Yes.   Following up on 9/1 for COVID vaccination. OB follow up on 9/8. Will need to schedule OB  faculty clinic appointment at next visit.

## 2021-02-12 ENCOUNTER — Ambulatory Visit (INDEPENDENT_AMBULATORY_CARE_PROVIDER_SITE_OTHER): Payer: Medicaid Other

## 2021-02-12 ENCOUNTER — Other Ambulatory Visit: Payer: Self-pay

## 2021-02-12 DIAGNOSIS — Z23 Encounter for immunization: Secondary | ICD-10-CM | POA: Diagnosis present

## 2021-02-18 NOTE — Progress Notes (Signed)
  [facility] Prenatal Visit  Kristin Hunt is a 20 y.o. G3P1011 at [redacted]w[redacted]d here for routine follow up. She is dated by LMP. She reports fetal movement. She denies vaginal bleeding, contractions, or loss of fluid.    Patient reports having centralized chest pain that occurs intermittently and is sometimes associated with a brief feeling of SOB. She denies current chest tightness but states that she last had an episode earlier this week. She states that the episodes or short lived and do not appear to be associated with exertion. She denies hx of cardiac issues. She denies GERD like symptoms associated with the pain and denies radiation of the pain. It is always in the same substernal area and is specifically described as "tightness" and not burning in character.    See flow sheet for details.  Vitals:   02/19/21 1453  BP: 120/73  Pulse: 83   General: female appearing stated age in no acute distress Cardio: Normal S1 and S2, no S3 or S4. Rhythm is regular. No murmurs or rubs.  Bilateral radial pulses palpable Pulm: Clear to auscultation bilaterally, no crackles, wheezing, or diminished breath sounds. Normal respiratory effort, stable on RA Abdomen: Bowel sounds normal. Abdomen soft and non-tender. Gravid uterus  Extremities: No peripheral edema.   A/P: Pregnancy at [redacted]w[redacted]d.  Doing well.   Routine prenatal care:  Dating reviewed, dating tab is correct Fetal heart tones: Appropriate Fundal height: within expected range.  The patient does not have a history of HSV and valacyclovir is not indicated at this time.  The patient does not have a history of Cesarean delivery and no referral to [facility] is indicated Infant feeding choice: Breastfeeding Contraception choice: Depo-Provera Infant circumcision desired yes Influenza vaccine not administered as patient declined, will continue to discuss.   Tdap was not given today. Previously administered on 01/22/21 COVID  vaccination was discussed and patient received vaccine on 02/12/21.  Pregnancy education regarding benefits of breastfeeding, contraception, fetal growth, expected weight gain, and safe infant sleep were discussed.  Preterm labor and fetal movement precautions reviewed.   2. Pregnancy issues include the following and were addressed as appropriate today:  Anemia of Pregnancy: patient is to continue iron supplementation daily    Problem list and pregnancy box updated: Yes.  Chest Pain: patient counseled to report to MAU during chest pain episode if it re-occurs, due to no active chest pain during today's visit, we discussed return precautions and patient is agreeable to trial of pepcid ac to see if symptoms may possibly be related to GERD.  Patient to follow up in two weeks, no current availability for faculty OB clinic at this time.   Follow up 2 weeks.

## 2021-02-19 ENCOUNTER — Ambulatory Visit (INDEPENDENT_AMBULATORY_CARE_PROVIDER_SITE_OTHER): Payer: Medicaid Other | Admitting: Family Medicine

## 2021-02-19 ENCOUNTER — Other Ambulatory Visit: Payer: Self-pay

## 2021-02-19 VITALS — BP 120/73 | HR 83 | Wt 160.4 lb

## 2021-02-19 DIAGNOSIS — Z3483 Encounter for supervision of other normal pregnancy, third trimester: Secondary | ICD-10-CM

## 2021-02-19 DIAGNOSIS — O99013 Anemia complicating pregnancy, third trimester: Secondary | ICD-10-CM

## 2021-02-19 MED ORDER — FAMOTIDINE 10 MG PO TABS: 10.0000 mg | ORAL_TABLET | Freq: Two times a day (BID) | ORAL | 1 refills | Status: DC

## 2021-02-19 NOTE — Patient Instructions (Signed)
Signs and Symptoms of Labor Labor is the body's natural process of moving the baby and the placenta out of the uterus. The process of labor usually starts when the baby is full-term, between 37 and 40 weeks of pregnancy. Signs and symptoms that you are close to going into labor As your body prepares for labor and the birth of your baby, you may notice the following symptoms in the weeks and days before true labor starts: Passing a small amount of thick, bloody mucus from your vagina. This is called normal bloody show or losing your mucus plug. This may happen more than a week before labor begins, or right before labor begins, as the opening of the cervix starts to widen (dilate). For some women, the entire mucus plug passes at once. For others, pieces of the mucus plug may gradually pass over several days. Your baby moving (dropping) lower in your pelvis to get into position for birth (lightening). When this happens, you may feel more pressure on your bladder and pelvic bone and less pressure on your ribs. This may make it easier to breathe. It may also cause you to need to urinate more often and have problems with bowel movements. Having "practice contractions," also called Braxton Hicks contractions or false labor. These occur at irregular (unevenly spaced) intervals that are more than 10 minutes apart. False labor contractions are common after exercise or sexual activity. They will stop if you change position, rest, or drink fluids. These contractions are usually mild and do not get stronger over time. They may feel like: A backache or back pain. Mild cramps, similar to menstrual cramps. Tightening or pressure in your abdomen. Other early symptoms include: Nausea or loss of appetite. Diarrhea. Having a sudden burst of energy, or feeling very tired. Mood changes. Having trouble sleeping. Signs and symptoms that labor has begun Signs that you are in labor may include: Having contractions that come  at regular (evenly spaced) intervals and increase in intensity. This may feel like more intense tightening or pressure in your abdomen that moves to your back. Contractions may also feel like rhythmic pain in your upper thighs or back that comes and goes at regular intervals. For first-time mothers, this change in intensity of contractions often occurs at a more gradual pace. Women who have given birth before may notice a more rapid progression of contraction changes. Feeling pressure in the vaginal area. Your water breaking (rupture of membranes). This is when the sac of fluid that surrounds your baby breaks. Fluid leaking from your vagina may be clear or blood-tinged. Labor usually starts within 24 hours of your water breaking, but it may take longer to begin. Some women may feel a sudden gush of fluid. Others notice that their underwear repeatedly becomes damp. Follow these instructions at home:  When labor starts, or if your water breaks, call your health care provider or nurse care line. Based on your situation, they will determine when you should go in for an exam. During early labor, you may be able to rest and manage symptoms at home. Some strategies to try at home include: Breathing and relaxation techniques. Taking a warm bath or shower. Listening to music. Using a heating pad on the lower back for pain. If you are directed to use heat: Place a towel between your skin and the heat source. Leave the heat on for 20-30 minutes. Remove the heat if your skin turns bright red. This is especially important if you are unable to   feel pain, heat, or cold. You may have a greater risk of getting burned. Contact a health care provider if: Your labor has started. Your water breaks. Get help right away if: You have painful, regular contractions that are 5 minutes apart or less. Labor starts before you are [redacted] weeks along in your pregnancy. You have a fever. You have bright red blood coming from  your vagina. You do not feel your baby moving. You have a severe headache with or without vision problems. You have severe nausea, vomiting, or diarrhea. You have chest pain or shortness of breath. These symptoms may represent a serious problem that is an emergency. Do not wait to see if the symptoms will go away. Get medical help right away. Call your local emergency services (911 in the [place] Do not drive yourself to the hospital. Summary Labor is your body's natural process of moving your baby and the placenta out of your uterus. The process of labor usually starts when your baby is full-term, between 37 and 40 weeks of pregnancy. When labor starts, or if your water breaks, call your health care provider or nurse care line. Based on your situation, they will determine when you should go in for an exam. This information is not intended to replace advice given to you by your health care provider. Make sure you discuss any questions you have with your health care provider. Document Revised: 03/22/2020 Document Reviewed: 03/22/2020 Elsevier Patient Education  2022 [employer]vier Inc.  

## 2021-03-10 ENCOUNTER — Other Ambulatory Visit (HOSPITAL_COMMUNITY)
Admission: RE | Admit: 2021-03-10 | Discharge: 2021-03-10 | Disposition: A | Discharge status: Home / Self Care | Payer: Medicaid Other | Source: Ambulatory Visit | Attending: Family Medicine | Admitting: Family Medicine

## 2021-03-10 ENCOUNTER — Ambulatory Visit (INDEPENDENT_AMBULATORY_CARE_PROVIDER_SITE_OTHER): Payer: Medicaid Other | Admitting: Family Medicine

## 2021-03-10 ENCOUNTER — Other Ambulatory Visit: Payer: Self-pay

## 2021-03-10 VITALS — BP 112/72 | HR 81 | Wt 167.6 lb

## 2021-03-10 DIAGNOSIS — Z3483 Encounter for supervision of other normal pregnancy, third trimester: Secondary | ICD-10-CM

## 2021-03-10 LAB — POCT WET PREP (WET MOUNT)
Clue Cells Wet Prep Whiff POC: NEGATIVE
Trichomonas Wet Prep HPF POC: ABSENT
WBC, Wet Prep HPF POC: >20

## 2021-03-10 LAB — OB RESULTS CONSOLE GC/CHLAMYDIA: Gonorrhea: NEGATIVE

## 2021-03-10 MED ORDER — TERCONAZOLE 0.8 % VA CREA: 1.0000 | TOPICAL_CREAM | Freq: Every day | VAGINAL | 0 refills | Status: DC

## 2021-03-10 NOTE — Patient Instructions (Signed)
It was wonderful seeing you today!  Congratulations on making it at 36 weeks, you are almost term.  I collected some samples today which are routine.  Those results will go to your MyChart.  If there are any abnormalities I will call you.  Regarding delivery we will tentatively plan for delivery at 39 weeks with induction.  You can be scheduled for this induction at the next visit.  If you have any vaginal bleeding, gush of fluid, contractions, feel decreased baby movement please seek medical attention at the maternal assessment unit in the bottom of the [facility].  If you have any questions or concerns call the clinic.  I hope you have a wonderful afternoon!

## 2021-03-10 NOTE — Progress Notes (Addendum)
  [facility] Prenatal Visit  Kristin Hunt is a 21 y.o. G3P1011 at [redacted]w[redacted]d here for routine follow up. She is dated by LMP.  She reports no bleeding, no contractions, no cramping, and no leaking. She reports fetal movement. She denies vaginal bleeding, contractions, or loss of fluid. See flow sheet for details.  Vitals:   03/10/21 0929  BP: 112/72  Pulse: 81    A/P: Pregnancy at [redacted]w[redacted]d.  Doing well.   Routine prenatal care  Dating reviewed, dating tab is correct Fetal heart tones Appropriate 128 Fundal height within expected range.  36 Fetal position confirmed Vertex using Ultrasound .  GBS collected today. .  Repeat GC/CT collected today.  The patient does not have a history of HSV and valacyclovir is not indicated at this time.  Infant feeding choice: Breastfeeding Contraception choice: Depo-Provera Infant circumcision desired yes Influenza vaccine not administered as patient declined, will continue to discuss.   Tdap previously administered between 27-36 weeks  01/22/21 COVID vaccination was discussed and was given at previous visit.  Pregnancy education regarding preterm labor, fetal movement,  benefits of breastfeeding, contraception, fetal growth, expected weight gain, and safe infant sleep were discussed.    2. Pregnancy issues include the following and were addressed as appropriate today:  History of vacuum-assisted vaginal delivery with shoulder dystocia-discussed with high risk OB at previous visit at and they reported no need for high risk OB management.  Discussed with patient and planning for IOL at 39 weeks Repeat GBS collected today Repeat GC/CH collected today Considerable amount of discharge on vaginal exam so wet prep collected which was positive for yeast infection, sent prescription for terconazole to patient's pharmacy Varicella nonimmune-needs postpartum vaccination. Follow-up scheduled in 1 week, induction will need to be scheduled at this  visit   Problem list and pregnancy box updated: Yes.  Follow up 1 week.

## 2021-03-11 ENCOUNTER — Telehealth: Payer: Self-pay | Admitting: Family Medicine

## 2021-03-11 LAB — CERVICOVAGINAL ANCILLARY ONLY
Chlamydia: POSITIVE — AB
Comment: NEGATIVE
Comment: NORMAL
Neisseria Gonorrhea: NEGATIVE

## 2021-03-11 NOTE — Telephone Encounter (Signed)
Attempted to call patient regarding positive chlamydia results.  She will need to be treated for this.  She did not answer and was unable to leave voicemail.  Will try again tomorrow.

## 2021-03-12 ENCOUNTER — Telehealth: Payer: Self-pay | Admitting: Family Medicine

## 2021-03-12 MED ORDER — AZITHROMYCIN 500 MG PO TABS: 1000.0000 mg | ORAL_TABLET | Freq: Every day | ORAL | 0 refills | Status: DC

## 2021-03-12 NOTE — Telephone Encounter (Signed)
Called patient regarding positive chlamydia test.  Discussed results and that she and her partner both need to be treated.  She reports that her partner has a physician and does not need partner therapy.  Sent 1 g of azithromycin to patient's pharmacy.  We will need a test of cure at a future visit.

## 2021-03-14 LAB — CULTURE, BETA STREP (GROUP B ONLY): Strep Gp B Culture: POSITIVE — AB

## 2021-03-17 ENCOUNTER — Ambulatory Visit (INDEPENDENT_AMBULATORY_CARE_PROVIDER_SITE_OTHER): Payer: Medicaid Other | Admitting: Family Medicine

## 2021-03-17 ENCOUNTER — Other Ambulatory Visit: Payer: Self-pay

## 2021-03-17 ENCOUNTER — Encounter: Payer: Self-pay | Admitting: Family Medicine

## 2021-03-17 VITALS — BP 109/71 | HR 85 | Wt 166.4 lb

## 2021-03-17 DIAGNOSIS — Z3493 Encounter for supervision of normal pregnancy, unspecified, third trimester: Secondary | ICD-10-CM

## 2021-03-17 DIAGNOSIS — A749 Chlamydial infection, unspecified: Secondary | ICD-10-CM

## 2021-03-17 MED ORDER — AZITHROMYCIN 500 MG PO TABS
1000.0000 mg | ORAL_TABLET | Freq: Once | ORAL | Status: AC
  Administered 2021-03-25: 1000 mg via ORAL

## 2021-03-17 NOTE — Progress Notes (Signed)
      [facility] Prenatal Visit  Kristin Hunt is a 20 y.o. G3P1011 at [redacted]w[redacted]d here for routine follow up. She is dated by LMP.  She reports no bleeding, no contractions, no cramping, and no leaking. She reports fetal movement. She denies vaginal bleeding, contractions, or loss of fluid. See flow sheet for details.  Vitals:   03/17/21 0934  BP: 109/71  Pulse: 85    A/P: Pregnancy at [redacted]w[redacted]d.  Doing well.   Routine prenatal care  Dating reviewed, dating tab is correct Fetal heart tones Appropriate Fundal height within expected range.  Fetal position confirmed Vertex using Leopold's .   GBS not collected today due to GBS positive on 03/10/21.  Repeat GC/CT not collected today due to collected on 03/10/21. She tested positive for Chlamydia and was not treated. She did not have a transportation to get to the pharmacy.  The patient does not have a history of HSV and valacyclovir is not indicated at this time.  Infant feeding choice: Breastfeeding Contraception choice: Depo-Provera Infant circumcision desired: yes  Influenza vaccine not administered as patient declined, will continue to discuss.   Tdap previously administered between 27-36 weeks  COVID vaccination was discussed and was given during previous visit.  Pregnancy education regarding preterm labor, fetal movement,  benefits of breastfeeding, contraception, fetal growth, expected weight gain, and safe infant sleep were discussed.    2. Pregnancy issues include the following and were addressed as appropriate today:  History of vacuum-assisted vaginal delivery with shoulder dystocia. Planned for IOL midnight 10/15. Pt to arrive 10/14 at 11:45 PM.  Untreated Chlamydia - given Azithrymocin 1000 mg in office today. Will need TOC.  GBS positive - requires antibiotics prior to delivery  Varicella Non-immune - needs postpartum vaccination  Housing instability - recommend CSW consult after delivery   Problem list and  pregnancy box updated: Yes.   Follow up 1 week as scheduled with Dr. Nobie Putnam.   Katha Cabal, DO

## 2021-03-17 NOTE — Patient Instructions (Signed)
It was wonderful seeing you today!  You're almost there.   Your induction was scheduled for ____________________.  If you have any vaginal bleeding, gush of fluid, contractions, feel decreased baby movement please seek medical attention at the maternal assessment unit in the bottom of the [facility].  If you have any questions or concerns call the clinic.  I hope you have a wonderful afternoon!

## 2021-03-18 ENCOUNTER — Other Ambulatory Visit: Payer: Self-pay | Admitting: Advanced Practice Midwife

## 2021-03-25 ENCOUNTER — Telehealth: Payer: Self-pay

## 2021-03-25 ENCOUNTER — Other Ambulatory Visit: Payer: Self-pay | Admitting: Advanced Practice Midwife

## 2021-03-25 ENCOUNTER — Telehealth: Payer: Self-pay | Admitting: Family Medicine

## 2021-03-25 ENCOUNTER — Ambulatory Visit (INDEPENDENT_AMBULATORY_CARE_PROVIDER_SITE_OTHER): Payer: Medicaid Other | Admitting: Family Medicine

## 2021-03-25 ENCOUNTER — Other Ambulatory Visit: Payer: Self-pay

## 2021-03-25 DIAGNOSIS — Z3483 Encounter for supervision of other normal pregnancy, third trimester: Secondary | ICD-10-CM

## 2021-03-25 DIAGNOSIS — A749 Chlamydial infection, unspecified: Secondary | ICD-10-CM

## 2021-03-25 MED ORDER — ONDANSETRON 4 MG PO TBDP: 4.0000 mg | ORAL_TABLET | Freq: Three times a day (TID) | ORAL | 0 refills | Status: DC | PRN

## 2021-03-25 MED ORDER — AZITHROMYCIN 1 G PO PACK: 1.0000 g | PACK | Freq: Once | ORAL | 0 refills | Status: AC

## 2021-03-25 NOTE — Telephone Encounter (Signed)
Patient calls nurse line regarding vomiting after taking PO azithromycin. Patient reports that she vomited at around 12:10.   Please advise if she needs to do anything further for treatment.   Veronda Prude, RN

## 2021-03-25 NOTE — Telephone Encounter (Addendum)
Called the maternal assessment unit for recommendations regarding further care.  Spoke with providers in the MAU as well as had secure chat with other providers in the MAU.  Recommendations included azithromycin powder with Zofran, and infusion at the infusion center.  I then called patient regarding medication issues.  She reports that she threw up approximately 40 minutes after taking the medication.  Discussed options for treatment including powder azithromycin and an infusion at the infusion center.  I have sent in a prescription for ODT Zofran which she is going to take 20 to 30 minutes prior to taking the 1 g of powder.  If she has any issues or vomits after this she will call and she will need to be scheduled with the infusion center tomorrow for gram of azithromycin.  She is agreeable to this.  Stressed the importance of going to the pharmacy tonight and getting the medication so that if needed we can schedule the infusion.  Also discussed the importance of abstinence and that her partner needed to be treated for this infection as well.  She understands and is agreeable.  Has no questions or concerns at this time.

## 2021-03-25 NOTE — Patient Instructions (Signed)
It was great seeing you today.  Your baby looks well and I have no concerns.  You are scheduled for induction on 10/14 to please arrive to labor and delivery around 11:45 PM.  We have retreated you with azithromycin for your infection.  Please remain abstinent and your partner will need treatment.  You should both remain abstinent for 7 days after treatment and you will need a test of cure after delivery.  If you have any vaginal bleeding, gush of fluid, decreased baby movement or regular contractions between now and then please go be assessed in the maternal assessment unit.  If you have any questions or concerns call the clinic.  Hope you have a wonderful afternoon!

## 2021-03-25 NOTE — Progress Notes (Signed)
  [facility] Prenatal Visit  Kristin Hunt is a 20 y.o. G3P1011 at [redacted]w[redacted]d here for routine follow up. She is dated by LMP.  She reports no bleeding, no cramping, no leaking, and occasional periods of contractions . She reports fetal movement. She denies vaginal bleeding, contractions, or loss of fluid. See flow sheet for details.  Vitals:   03/25/21 1043  BP: 106/63  Pulse: 79    A/P: Pregnancy at [redacted]w[redacted]d.  Doing well.   Routine prenatal care:  Dating reviewed, dating tab is correct Fetal heart tones Appropriate 143 Fundal height within expected range.  Fetal position confirmed Vertex using Ultrasound .  Infant feeding choice: Breastfeeding Contraception choice: Depo-Provera Infant circumcision desired yes Influenza vaccine not administered as patient declined, will continue to discuss.   Tdap previously administered between 27-36 weeks  GBS and gc/chlamydia testing results were reviewed today. Patient is GBS pos and will need GBS prophylaxis.    Pregnancy education regarding labor, fetal movement,  benefits of breastfeeding, contraception, and safe infant sleep were discussed.  Labor and fetal movement precautions reviewed. Induction of labor discussed. Scheduled for induction at approximately 41 weeks. BPP scheduled between 40-41 weeks.   2. Pregnancy issues include the following and were addressed as appropriate today:   History of vacuum-assisted vaginal delivery with shoulder dystocia: Patient has been scheduled for IOL on 10/15.  Patient to arrive on 10/14 at 11:45 PM for induction at midnight.  -Untreated chlamydia: Patient was given additional dose of azithromycin 1000 mg in office today.  She called and reported that she threw it up.  Additional dose of azithromycin 1000 mg but in packet form was sent to patient's pharmacy along with single dose of ODT Zofran.  Counseled on the importance of taking the medication up and taking it tonight as well as calling if there  are any complications  -GBS positive: Will require antibiotics prior to delivery -Varicella nonimmune: Will need postpartum vaccination -Housing instability: Recommended case alert consult after delivery   Problem list and pregnancy box updated: Yes.   Follow up 1 week.

## 2021-03-26 NOTE — Telephone Encounter (Signed)
Error encounter. 

## 2021-03-28 ENCOUNTER — Inpatient Hospital Stay (HOSPITAL_COMMUNITY)
Admission: AD | Admit: 2021-03-28 | Discharge: 2021-03-31 | DRG: 807 | Disposition: A | Discharge status: Home / Self Care | Payer: Medicaid Other | Attending: Family Medicine | Admitting: Family Medicine

## 2021-03-28 ENCOUNTER — Inpatient Hospital Stay (HOSPITAL_COMMUNITY): Payer: Medicaid Other | Attending: Obstetrics & Gynecology

## 2021-03-28 ENCOUNTER — Encounter (HOSPITAL_COMMUNITY): Payer: Self-pay | Admitting: Obstetrics & Gynecology

## 2021-03-28 ENCOUNTER — Inpatient Hospital Stay (HOSPITAL_COMMUNITY)
Admission: AD | Admit: 2021-03-28 | Payer: Medicaid Other | Source: Home / Self Care | Admitting: Obstetrics & Gynecology

## 2021-03-28 ENCOUNTER — Other Ambulatory Visit: Payer: Self-pay

## 2021-03-28 DIAGNOSIS — Z20822 Contact with and (suspected) exposure to covid-19: Secondary | ICD-10-CM | POA: Diagnosis present

## 2021-03-28 DIAGNOSIS — F17210 Nicotine dependence, cigarettes, uncomplicated: Secondary | ICD-10-CM | POA: Diagnosis present

## 2021-03-28 DIAGNOSIS — A568 Sexually transmitted chlamydial infection of other sites: Secondary | ICD-10-CM | POA: Diagnosis present

## 2021-03-28 DIAGNOSIS — Z7982 Long term (current) use of aspirin: Secondary | ICD-10-CM

## 2021-03-28 DIAGNOSIS — O99824 Streptococcus B carrier state complicating childbirth: Secondary | ICD-10-CM | POA: Diagnosis present

## 2021-03-28 DIAGNOSIS — Z3A38 38 weeks gestation of pregnancy: Secondary | ICD-10-CM | POA: Diagnosis not present

## 2021-03-28 DIAGNOSIS — O99820 Streptococcus B carrier state complicating pregnancy: Secondary | ICD-10-CM | POA: Diagnosis not present

## 2021-03-28 DIAGNOSIS — O9832 Other infections with a predominantly sexual mode of transmission complicating childbirth: Principal | ICD-10-CM | POA: Diagnosis present

## 2021-03-28 DIAGNOSIS — O99334 Smoking (tobacco) complicating childbirth: Secondary | ICD-10-CM | POA: Diagnosis present

## 2021-03-28 DIAGNOSIS — O26893 Other specified pregnancy related conditions, third trimester: Secondary | ICD-10-CM | POA: Diagnosis present

## 2021-03-28 DIAGNOSIS — Z3490 Encounter for supervision of normal pregnancy, unspecified, unspecified trimester: Secondary | ICD-10-CM | POA: Diagnosis present

## 2021-03-28 LAB — CBC
HCT: 37 % (ref 36.0–46.0)
Hemoglobin: 11.6 g/dL — ABNORMAL LOW (ref 12.0–15.0)
MCH: 26.4 pg (ref 26.0–34.0)
MCHC: 31.4 g/dL (ref 30.0–36.0)
MCV: 84.1 fL (ref 80.0–100.0)
Platelets: 268 10*3/uL (ref 150–400)
RBC: 4.4 MIL/uL (ref 3.87–5.11)
RDW: 13.2 % (ref 11.5–15.5)
WBC: 5.9 10*3/uL (ref 4.0–10.5)
nRBC: 0 % (ref 0.0–0.2)

## 2021-03-28 LAB — RESP PANEL BY RT-PCR (FLU A&B, COVID) ARPGX2
Influenza A by PCR: NEGATIVE
Influenza B by PCR: NEGATIVE
SARS Coronavirus 2 by RT PCR: NEGATIVE

## 2021-03-28 LAB — URINALYSIS, ROUTINE W REFLEX MICROSCOPIC
Bilirubin Urine: NEGATIVE
Glucose, UA: NEGATIVE mg/dL
Hgb urine dipstick: NEGATIVE
Ketones, ur: NEGATIVE mg/dL
Leukocytes,Ua: NEGATIVE
Nitrite: NEGATIVE
Protein, ur: NEGATIVE mg/dL
Specific Gravity, Urine: 1.016 (ref 1.005–1.030)
pH: 8 (ref 5.0–8.0)

## 2021-03-28 LAB — TYPE AND SCREEN
ABO/RH(D): B POS
Antibody Screen: NEGATIVE

## 2021-03-28 MED ORDER — AZITHROMYCIN 500 MG PO TABS
1000.0000 mg | ORAL_TABLET | Freq: Once | ORAL | Status: AC
  Administered 2021-03-28: 1000 mg via ORAL
  Filled 2021-03-28: qty 2

## 2021-03-28 MED ORDER — SOD CITRATE-CITRIC ACID 500-334 MG/5ML PO SOLN: 30.0000 mL | ORAL | Status: DC | PRN

## 2021-03-28 MED ORDER — PENICILLIN G POT IN DEXTROSE 60000 UNIT/ML IV SOLN
3.0000 10*6.[IU] | INTRAVENOUS | Status: DC
  Administered 2021-03-29 (×4): 3 10*6.[IU] via INTRAVENOUS
  Filled 2021-03-28 (×4): qty 50

## 2021-03-28 MED ORDER — LIDOCAINE HCL (PF) 1 % IJ SOLN
30.0000 mL | INTRAMUSCULAR | Status: DC | PRN
Start: 2021-03-28 — End: 2021-03-29
  Filled 2021-03-28: qty 30

## 2021-03-28 MED ORDER — OXYCODONE-ACETAMINOPHEN 5-325 MG PO TABS
1.0000 | ORAL_TABLET | ORAL | Status: DC | PRN
Start: 2021-03-28 — End: 2021-03-29

## 2021-03-28 MED ORDER — SODIUM CHLORIDE 0.9 % IV SOLN
5.0000 10*6.[IU] | Freq: Once | INTRAVENOUS | Status: AC
  Administered 2021-03-28: 5 10*6.[IU] via INTRAVENOUS
  Filled 2021-03-28: qty 5

## 2021-03-28 MED ORDER — OXYTOCIN BOLUS FROM INFUSION
333.0000 mL | Freq: Once | INTRAVENOUS | Status: AC
  Administered 2021-03-29: 333 mL via INTRAVENOUS

## 2021-03-28 MED ORDER — LACTATED RINGERS IV SOLN: 500.0000 mL | INTRAVENOUS | Status: DC | PRN

## 2021-03-28 MED ORDER — TERBUTALINE SULFATE 1 MG/ML IJ SOLN: 0.2500 mg | Freq: Once | INTRAMUSCULAR | Status: DC | PRN

## 2021-03-28 MED ORDER — ONDANSETRON HCL 4 MG/2ML IJ SOLN
4.0000 mg | Freq: Four times a day (QID) | INTRAMUSCULAR | Status: DC | PRN
  Administered 2021-03-28: 4 mg via INTRAVENOUS
  Filled 2021-03-28: qty 2

## 2021-03-28 MED ORDER — MISOPROSTOL 25 MCG QUARTER TABLET: 25.0000 ug | ORAL_TABLET | ORAL | Status: DC | PRN

## 2021-03-28 MED ORDER — OXYCODONE-ACETAMINOPHEN 5-325 MG PO TABS: 2.0000 | ORAL_TABLET | ORAL | Status: DC | PRN

## 2021-03-28 MED ORDER — OXYTOCIN-SODIUM CHLORIDE 30-0.9 UT/500ML-% IV SOLN
2.5000 [IU]/h | INTRAVENOUS | Status: DC
  Administered 2021-03-29: 2.5 [IU]/h via INTRAVENOUS
  Filled 2021-03-28: qty 500

## 2021-03-28 MED ORDER — FENTANYL CITRATE (PF) 100 MCG/2ML IJ SOLN: 100.0000 ug | INTRAMUSCULAR | Status: DC | PRN

## 2021-03-28 MED ORDER — LACTATED RINGERS IV SOLN: INTRAVENOUS | Status: DC

## 2021-03-28 MED ORDER — ACETAMINOPHEN 325 MG PO TABS: 650.0000 mg | ORAL_TABLET | ORAL | Status: DC | PRN

## 2021-03-28 NOTE — MAU Note (Signed)
Having pain when she urinates, started earlier today. Having some contractions, every 4 min- they make her feel short of breath.denies bleeding, denies ROM.  Reports +ROM

## 2021-03-28 NOTE — H&P (Addendum)
OBSTETRIC ADMISSION HISTORY AND PHYSICAL  Kristin Hunt is a 20 y.o. female G3P1011 with IUP at [redacted]w[redacted]d by LMP presenting for eIOL, however noted to be contracting on her own upon arrival to [facility]. She reports +FMs, No LOF, no VB, no blurry vision, headaches or peripheral edema, and RUQ pain.  She plans on breast feeding. She request Depo-Provera for birth control. She received her prenatal care at [facility]   Dating: By LMP --->  Estimated Date of Delivery: 04/04/21  Sono:    @[redacted]w[redacted]d , CWD, normal anatomy, Cephalic presentation, anterior lie, 1446g, 76% EFW   Prenatal History/Complications:   History of a vacuum-assisted vaginal delivery with shoulder dystocia: Requiring McRoberts and suprapubic pressure.  Apgars were 3, 7.  Fetal weight 3484 g.  History of pregnancy loss at 16 weeks: Patient reported, occurred in [place] and she does not know where she received care  Chlamydia infection in the third trimester: Patient with positive chlamydia test on 9/27.  Was prescribed 1 g azithromycin but did not pick it up from the pharmacy.  Was given a dose of azithromycin at her next visit on 10/4 but reports she threw it up less than 1 hour after taking it.  She was then given another dose on 10/12 and subsequently vomited it up.  A prescription for azithromycin 1 g powder was called into her pharmacy along with 4 mg of Zofran.  She was instructed to pick it up that day and call our clinic if she vomited it up.  We received no call.  Patient reports that she went to the pharmacy to get the medication but that they did not have it.  GBS positive: Will need antibiotics during delivery  Varicella nonimmune  Past Medical History: 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(HCC)     Past Surgical History: Past Surgical History:  Procedure Laterality Date   WISDOM TOOTH EXTRACTION     2018    Obstetrical History: OB History     Gravida  3   Para  1   Term  1   Preterm   0   AB  1   Living  1      SAB  1   IAB  0   Ectopic  0   Multiple  0   Live Births  1           Social History Social History   Socioeconomic History   Marital status: Single    Spouse name: Not on file   Number of children: Not on file   Years of education: Not on file   Highest education level: Not on file  Occupational History   Not on file  Tobacco Use   Smoking status: Light Smoker    Types: Cigarettes   Smokeless tobacco: Never   Tobacco comments:    smoked two weeks ago  Vaping Use   Vaping Use: Never used  Substance and Sexual Activity   Alcohol use: No   Drug use: No   Sexual activity: Not Currently  Other Topics Concern   Not on file  Social History Narrative   Lives at home with brother, father and step mom. Moved to [place] from [place] in August 2017. 2 dogs in the home. Both parents smoke in the home.    Social Determinants of Health   Financial Resource Strain: Not on file  Food Insecurity: Not on file  Transportation Needs: Not on file  Physical  Activity: Not on file  Stress: Not on file  Social Connections: Not on file    Family History: Family History  Problem Relation Age of Onset   Diabetes Paternal Grandmother    Lung cancer Paternal Grandmother     Allergies: No Known Allergies  Medications Prior to Admission  Medication Sig Dispense Refill Last Dose   aspirin EC 81 MG tablet Take 1 tablet (81 mg total) by mouth daily. Swallow whole. 30 tablet 11    famotidine (PEPCID) 10 MG tablet Take 1 tablet (10 mg total) by mouth 2 (two) times daily. 60 tablet 1    Iron, Ferrous Sulfate, 325 (65 Fe) MG TABS Take 1 tablet by mouth every other day. 30 tablet 2    ondansetron (ZOFRAN ODT) 4 MG disintegrating tablet Take 1 tablet (4 mg total) by mouth every 8 (eight) hours as needed for nausea or vomiting. 1 tablet 0    Prenatal Vit-Fe Fumarate-FA (PNV PRENATAL PLUS MULTIVITAMIN PO) Take by mouth.      terconazole (TERAZOL 3) 0.8 %  vaginal cream Place 1 applicator vaginally at bedtime. 20 g 0      Review of Systems   All systems reviewed and negative except as stated in HPI  Blood pressure 116/79, pulse 94, temperature 98.1 F (36.7 C), temperature source Oral, resp. rate 17, height 5\' 3"  (1.6 m), weight 76.2 kg, last menstrual period 06/28/2020, SpO2 100 %, unknown if currently breastfeeding. General appearance: alert, cooperative, and no distress Lungs: Speaking in clear sentences Heart: regular rate  Abdomen: Gravid, soft, nontender Extremities: no sign of DVT Presentation: cephalic by ultrasound Fetal monitoringBaseline: 125 bpm, Variability: Good {> 6 bpm), Accelerations: Reactive, and Decelerations: Absent Uterine activityFrequency: Every 2-3 minutes Dilation: 4 Effacement (%): 60 Station: -3 Exam by:: [email or web address] RN   Prenatal labs: ABO, Rh: --/--/PENDING (10/15 2000) Antibody: PENDING (10/15 2000) Rubella: 1.62 (06/14 1717) RPR: Non Reactive (08/11 1021)  HBsAg: Negative (06/14 1717)  HIV: Non Reactive (08/11 1021)  GBS: Positive/-- (09/27 1108)  1 hr Glucola normal Genetic screening declined Anatomy 11-13-2005 normal  Prenatal Transfer Tool  Maternal Diabetes: No Genetic Screening: Declined Maternal Ultrasounds/Referrals: Normal Fetal Ultrasounds or other Referrals:  None Maternal Substance Abuse:  No Significant Maternal Medications:  None Significant Maternal Lab Results: Group B Strep positive  Results for orders placed or performed during the hospital encounter of 03/28/21 (from the past 24 hour(s))  Urinalysis, Routine w reflex microscopic   Collection Time: 03/28/21  7:16 PM  Result Value Ref Range   Color, Urine YELLOW YELLOW   APPearance CLEAR CLEAR   Specific Gravity, Urine 1.016 1.005 - 1.030   pH 8.0 5.0 - 8.0   Glucose, UA NEGATIVE NEGATIVE mg/dL   Hgb urine dipstick NEGATIVE NEGATIVE   Bilirubin Urine NEGATIVE NEGATIVE   Ketones, ur NEGATIVE NEGATIVE mg/dL   Protein, ur  NEGATIVE NEGATIVE mg/dL   Nitrite NEGATIVE NEGATIVE   Leukocytes,Ua NEGATIVE NEGATIVE  Type and screen   Collection Time: 03/28/21  8:00 PM  Result Value Ref Range   ABO/RH(D) PENDING    Antibody Screen PENDING    Sample Expiration      03/31/2021,2359 Performed at [facility], [address], [place], [place]     Patient Active Problem List   Diagnosis Date Noted   Encounter for elective induction of labor 03/28/2021   Susceptible to Varicella (non-immune), currently pregnant in third trimester 01/28/2021   History of pregnancy loss in prior pregnancy,  currently pregnant 01/22/2021   History of shoulder dystocia in prior pregnancy 01/22/2021   Limited prenatal care in second trimester 11/26/2020   Supervision of normal intrauterine pregnancy in multigravida 11/26/2020   Homeless 11/26/2020   BV (bacterial vaginosis) 05/07/2020   Pseudoseizure     Assessment/Plan:  Jessia Kief is a 20 y.o. G3P1011 at [redacted]w[redacted]d here for eIOL  #Labor: Patient is contracting every 2 to 3 minutes.  The cervical exam changed from in the clinic 2 days ago.  No need for Cytotec or Foley balloon.  Plan on continuing expectant management at this time and will reassess for progression in 4 hours.  Consider initiating Pitocin at next check. #Pain: As needed epidural #FWB: Category 1, reassuring #ID:  GBS positive: Penicillin ordered #MOF: Breast-feeding #MOC: Depo-Provera #Circ:  Yes #Chlamydia positive: Patient unable to receive medications from pharmacy.  Has not been able to keep previous doses of azithromycin down.  Will give 1 g azithromycin oral with IV Zofran.  If she is unable to keep the azithromycin down we will give IV azithromycin. #Hx VAVD w/ shoulder dystocia: Plan for stools at bedside during delivery #Varicella nonimmune: Will need postpartum vaccination #Housing instability: Patient will need social work consult after delivery   Derrel Nip, MD  03/28/2021, 8:18  PM  GME ATTESTATION:  I saw and evaluated the patient. I agree with the findings and the plan of care as documented in the resident's note.  Leticia Penna, DO OB Fellow, [facility] 03/28/2021 10:05 PM

## 2021-03-29 ENCOUNTER — Encounter (HOSPITAL_COMMUNITY): Payer: Self-pay | Admitting: Obstetrics & Gynecology

## 2021-03-29 DIAGNOSIS — O99820 Streptococcus B carrier state complicating pregnancy: Secondary | ICD-10-CM

## 2021-03-29 DIAGNOSIS — O9832 Other infections with a predominantly sexual mode of transmission complicating childbirth: Secondary | ICD-10-CM

## 2021-03-29 DIAGNOSIS — Z3A38 38 weeks gestation of pregnancy: Secondary | ICD-10-CM

## 2021-03-29 LAB — RPR: RPR Ser Ql: NONREACTIVE

## 2021-03-29 MED ORDER — DIPHENHYDRAMINE HCL 25 MG PO CAPS: 25.0000 mg | ORAL_CAPSULE | Freq: Four times a day (QID) | ORAL | Status: DC | PRN

## 2021-03-29 MED ORDER — SIMETHICONE 80 MG PO CHEW: 80.0000 mg | CHEWABLE_TABLET | ORAL | Status: DC | PRN

## 2021-03-29 MED ORDER — COCONUT OIL OIL: 1.0000 "application " | TOPICAL_OIL | Status: DC | PRN

## 2021-03-29 MED ORDER — ONDANSETRON HCL 4 MG PO TABS: 4.0000 mg | ORAL_TABLET | ORAL | Status: DC | PRN

## 2021-03-29 MED ORDER — SENNOSIDES-DOCUSATE SODIUM 8.6-50 MG PO TABS
2.0000 | ORAL_TABLET | Freq: Every day | ORAL | Status: DC
  Administered 2021-03-30 – 2021-03-31 (×2): 2 via ORAL
  Filled 2021-03-29 (×2): qty 2

## 2021-03-29 MED ORDER — ACETAMINOPHEN 325 MG PO TABS
650.0000 mg | ORAL_TABLET | ORAL | Status: DC | PRN
  Administered 2021-03-29 – 2021-03-30 (×2): 650 mg via ORAL
  Filled 2021-03-29 (×3): qty 2

## 2021-03-29 MED ORDER — WITCH HAZEL-GLYCERIN EX PADS: 1.0000 "application " | MEDICATED_PAD | CUTANEOUS | Status: DC | PRN

## 2021-03-29 MED ORDER — TETANUS-DIPHTH-ACELL PERTUSSIS 5-2.5-18.5 LF-MCG/0.5 IM SUSY: 0.5000 mL | PREFILLED_SYRINGE | Freq: Once | INTRAMUSCULAR | Status: DC

## 2021-03-29 MED ORDER — BENZOCAINE-MENTHOL 20-0.5 % EX AERO
1.0000 "application " | INHALATION_SPRAY | CUTANEOUS | Status: DC | PRN
  Administered 2021-03-29: 1 via TOPICAL
  Filled 2021-03-29: qty 56

## 2021-03-29 MED ORDER — ZOLPIDEM TARTRATE 5 MG PO TABS: 5.0000 mg | ORAL_TABLET | Freq: Every evening | ORAL | Status: DC | PRN

## 2021-03-29 MED ORDER — PRENATAL MULTIVITAMIN CH
1.0000 | ORAL_TABLET | Freq: Every day | ORAL | Status: DC
  Administered 2021-03-30 – 2021-03-31 (×2): 1 via ORAL
  Filled 2021-03-29 (×2): qty 1

## 2021-03-29 MED ORDER — OXYTOCIN-SODIUM CHLORIDE 30-0.9 UT/500ML-% IV SOLN
1.0000 m[IU]/min | INTRAVENOUS | Status: DC
  Administered 2021-03-29: 2 m[IU]/min via INTRAVENOUS

## 2021-03-29 MED ORDER — TERBUTALINE SULFATE 1 MG/ML IJ SOLN: 0.2500 mg | Freq: Once | INTRAMUSCULAR | Status: DC | PRN

## 2021-03-29 MED ORDER — ONDANSETRON HCL 4 MG/2ML IJ SOLN: 4.0000 mg | INTRAMUSCULAR | Status: DC | PRN

## 2021-03-29 MED ORDER — IBUPROFEN 600 MG PO TABS
600.0000 mg | ORAL_TABLET | Freq: Four times a day (QID) | ORAL | Status: DC
  Administered 2021-03-29 – 2021-03-31 (×8): 600 mg via ORAL
  Filled 2021-03-29 (×8): qty 1

## 2021-03-29 MED ORDER — DIBUCAINE (PERIANAL) 1 % EX OINT: 1.0000 "application " | TOPICAL_OINTMENT | CUTANEOUS | Status: DC | PRN

## 2021-03-29 NOTE — Lactation Note (Signed)
This note was copied from a baby's chart. Lactation Consultation Note  Patient Name: Boy Kavya Haag ALPFX'T Date: 03/29/2021 Reason for consult: L&D Initial assessment;Mother's request;Term Age: < 1 hr Mom experienced with breastfeeding. Infant latched with increase in depth of swallows.  Mom denied any pain with the latch.  Mom to receive further LC support on the floor.  All questions answered at the end of the visit.   Maternal Data Has patient been taught Hand Expression?: Yes Does the patient have breastfeeding experience prior to this delivery?: Yes How long did the patient breastfeed?: 6 months  Feeding Mother's Current Feeding Choice: Breast Milk  LATCH Score Latch: Repeated attempts needed to sustain latch, nipple held in mouth throughout feeding, stimulation needed to elicit sucking reflex.  Audible Swallowing: Spontaneous and intermittent  Type of Nipple: Everted at rest and after stimulation  Comfort (Breast/Nipple): Soft / non-tender  Hold (Positioning): Assistance needed to correctly position infant at breast and maintain latch.  LATCH Score: 8   Lactation Tools Discussed/Used    Interventions Interventions: Education;Support pillows;Breast feeding basics reviewed;Assisted with latch;Position options;Skin to skin;Expressed milk;Hand express;Breast compression;Adjust position  Discharge    Consult Status Consult Status: Follow-up from L&D Date: 03/30/21 Follow-up type: In-patient    Tyshana Nishida  Nicholson-Springer 03/29/2021, 5:29 PM

## 2021-03-29 NOTE — Progress Notes (Signed)
Nakhia Levitan is a 20 y.o. G3P1011 at [redacted]w[redacted]d.  Subjective: Moderate discomfort. Coping well w/ comfort measures.   Objective: BP 108/65   Pulse (!) 59   Temp (!) 97.5 F (36.4 C) (Oral)   Resp 18   Ht 5\' 3"  (1.6 m)   Wt 76.2 kg   LMP 06/28/2020   SpO2 100%   BMI 29.76 kg/m    FHT:  FHR: 125 bpm, variability: mod,  accelerations:  15x15,  decelerations:  few variables and one prolonged to 90's. Better after turning pt to side.  UC:   Q 3-5 minutes, mod-strong Dilation: 6.5 Effacement (%): 80 Station: -2 Presentation: Vertex Exam by:: [email or web address], CNM AROM scant fluid. No meconium seen.   Labs: Results for orders placed or performed during the hospital encounter of 03/28/21 (from the past 24 hour(s))  Urinalysis, Routine w reflex microscopic     Status: None   Collection Time: 03/28/21  7:16 PM  Result Value Ref Range   Color, Urine YELLOW YELLOW   APPearance CLEAR CLEAR   Specific Gravity, Urine 1.016 1.005 - 1.030   pH 8.0 5.0 - 8.0   Glucose, UA NEGATIVE NEGATIVE mg/dL   Hgb urine dipstick NEGATIVE NEGATIVE   Bilirubin Urine NEGATIVE NEGATIVE   Ketones, ur NEGATIVE NEGATIVE mg/dL   Protein, ur NEGATIVE NEGATIVE mg/dL   Nitrite NEGATIVE NEGATIVE   Leukocytes,Ua NEGATIVE NEGATIVE  Resp Panel by RT-PCR (Flu A&B, Covid) Nasopharyngeal Swab     Status: None   Collection Time: 03/28/21  7:59 PM   Specimen: Nasopharyngeal Swab; Nasopharyngeal(NP) swabs in vial transport medium  Result Value Ref Range   SARS Coronavirus 2 by RT PCR NEGATIVE NEGATIVE   Influenza A by PCR NEGATIVE NEGATIVE   Influenza B by PCR NEGATIVE NEGATIVE  CBC     Status: Abnormal   Collection Time: 03/28/21  8:00 PM  Result Value Ref Range   WBC 5.9 4.0 - 10.5 K/uL   RBC 4.40 3.87 - 5.11 MIL/uL   Hemoglobin 11.6 (L) 12.0 - 15.0 g/dL   HCT 03/30/21 91.6 - 38.4 %   MCV 84.1 80.0 - 100.0 fL   MCH 26.4 26.0 - 34.0 pg   MCHC 31.4 30.0 - 36.0 g/dL   RDW 66.5 99.3 - 57.0 %   Platelets 268 150 - 400 K/uL    nRBC 0.0 0.0 - 0.2 %  Type and screen     Status: None   Collection Time: 03/28/21  8:00 PM  Result Value Ref Range   ABO/RH(D) B POS    Antibody Screen NEG    Sample Expiration      03/31/2021,2359 Performed at [facility], [address] [place], [place]     Assessment / Plan: [redacted]w[redacted]d week IUP Labor: Active Fetal Wellbeing:  Category I-II. Overall reassuring Pain Control:  Comfort measures Anticipated MOD:  SVD. Discussed pt's Hx shoulder dystocia. Pt says they had to push on her to get the baby out. Pushed x 3 hours. No injuries to baby. Discussed that we offer C/S for pts w/ Hx serious SD, but pt is OK proceeding w/ labor. States baby feels smaller then her last. EFW 76% at 28 weeks.   [redacted]w[redacted]d, Katrinka Blazing, CNM 03/29/2021 10:14 AM

## 2021-03-29 NOTE — Discharge Summary (Addendum)
Postpartum Discharge Summary     Patient Name: Kristin Hunt DOB: 12-Jan-2001 MRN: [id number]  Date of admission: 03/28/2021 Delivery date:03/29/2021  Delivering provider: Gerlene Fee  Date of discharge: 03/31/2021  Admitting diagnosis: Encounter for elective induction of labor [Z34.90] Intrauterine pregnancy: [redacted]w[redacted]d    Secondary diagnosis:  Active Problems:   Encounter for elective induction of labor   Vaginal delivery  Additional problems: Chlamydia     Discharge diagnosis: Term Pregnancy Delivered                                              Post partum procedures: n/a Augmentation: Pitocin Complications: None  Hospital course: Onset of Labor With Vaginal Delivery      20y.o. yo [id number] 371w1dasat 371w1das admitted in Latent Labor on 03/28/2021. Patient had an uncomplicated labor course as follows:  Membrane Rupture Time/Date: 9:54 AM ,03/29/2021   Delivery Method:Vaginal, Spontaneous  Episiotomy: None  Lacerations:  None  Patient had an uncomplicated postpartum course.  She is ambulating, tolerating a regular diet, passing flatus, and urinating well. Patient is discharged home in stable condition on 03/31/21.  Newborn Data: Birth date:03/29/2021  Birth time:4:16 PM  Gender:Female  Living status:Living  Apgars:8 ,9  Weight:3118 g   Magnesium Sulfate received: No BMZ received: No Rhophylac:N/A MMR:Yes T-DaP:Given prenatally Flu: No Transfusion:No  Physical exam  Vitals:   03/30/21 0511 03/30/21 1319 03/30/21 1939 03/31/21 0554  BP: 107/66 110/71 115/70 103/65  Pulse: 60 82 (!) 56 85  Resp: '18 18  18  ' Temp: 98.4 F (36.9 C) 98.7 F (37.1 C)  98.3 F (36.8 C)  TempSrc: Oral Other (Comment)  Oral  SpO2:  100%  99%  Weight:      Height:       General: alert, cooperative, and no distress Lochia: appropriate Uterine Fundus: firm Incision: N/A DVT Evaluation: No evidence of DVT seen on physical exam. Negative Homan's sign. No significant calf/ankle  edema. Labs: Lab Results  Component Value Date   WBC 13.6 (H) 03/30/2021   HGB 10.5 (L) 03/30/2021   HCT 32.6 (L) 03/30/2021   MCV 82.1 03/30/2021   PLT 256 03/30/2021   CMP Latest Ref Rng & Units 02/21/2020  Glucose 70 - 99 mg/dL 83  BUN 6 - 20 mg/dL 7  Creatinine 0.44 - 1.00 mg/dL 0.76  Sodium 135 - 145 mmol/L 134(L)  Potassium 3.5 - 5.1 mmol/L 3.0(L)  Chloride 98 - 111 mmol/L 103  CO2 22 - 32 mmol/L 15(L)  Calcium 8.9 - 10.3 mg/dL 9.8  Total Protein 6.5 - 8.1 g/dL 7.7  Total Bilirubin 0.3 - 1.2 mg/dL 0.7  Alkaline Phos 38 - 126 U/L 48  AST 15 - 41 U/L 25  ALT 0 - 44 U/L 26   Edinburgh Score: Edinburgh Postnatal Depression Scale Screening Tool 03/29/2021  I have been able to laugh and see the funny side of things. 1  I have looked forward with enjoyment to things. 1  I have blamed myself unnecessarily when things went wrong. 2  I have been anxious or worried for no good reason. 3  I have felt scared or panicky for no good reason. 1  Things have been getting on top of me. 2  I have been so unhappy that I have had difficulty sleeping. 2  I have felt sad or miserable.  2  I have been so unhappy that I have been crying. 1  The thought of harming myself has occurred to me. 0  Edinburgh Postnatal Depression Scale Total 15     After visit meds:  Allergies as of 03/31/2021   No Known Allergies      Medication List     STOP taking these medications    aspirin EC 81 MG tablet   famotidine 10 MG tablet Commonly known as: PEPCID   Iron (Ferrous Sulfate) 325 (65 Fe) MG Tabs   ondansetron 4 MG disintegrating tablet Commonly known as: Zofran ODT   terconazole 0.8 % vaginal cream Commonly known as: TERAZOL 3       TAKE these medications    acetaminophen 325 MG tablet Commonly known as: Tylenol Take 2 tablets (650 mg total) by mouth every 4 (four) hours as needed (for pain scale < 4).   ibuprofen 600 MG tablet Commonly known as: ADVIL Take 1 tablet (600 mg  total) by mouth every 6 (six) hours.   PNV PRENATAL PLUS MULTIVITAMIN PO Take by mouth.   sertraline 25 MG tablet Commonly known as: Zoloft Take 1 tablet (25 mg total) by mouth daily.         Discharge home in stable condition Infant Feeding: Breast Infant Disposition:home with mother Discharge instruction: per After Visit Summary and Postpartum booklet. Activity: Advance as tolerated. Pelvic rest for 6 weeks.  Diet: routine diet Future Appointments: Future Appointments  Date Time Provider [place]  04/28/2021  1:50 PM Lyndee Hensen, DO [facility] [place]   Follow up Visit:  Please schedule this patient for a In person postpartum visit in 4 weeks with the following provider: Any provider. Additional Postpartum F/U:Postpartum Depression checkup. Patient started on Zoloft at discharge.   Low risk pregnancy complicated by:  None Delivery mode:  Vaginal, Spontaneous  Anticipated Birth Control:  Depo  03/31/2021 Gerlene Fee, DO   I personally saw and evaluated the patient, performing the key elements of the service. I developed and verified the management plan that is described in the resident's/student's note, and I agree with the content with my edits above. VSS, HRR&R, Resp unlabored, Legs neg.  Nigel Berthold, CNM 04/02/2021 8:50 AM

## 2021-03-29 NOTE — Progress Notes (Signed)
LABOR PROGRESS NOTE  Memory Heinrichs is a 20 y.o. G3P1011 at [redacted]w[redacted]d  admitted for eIOL.  Subjective: Laying in bed on hands and knees on arrival. Extremely uncomfortable during contractions.   Objective: BP 115/70   Pulse 68   Temp 98.1 F (36.7 C) (Oral)   Resp 17   Ht 5\' 3"  (1.6 m)   Wt 76.2 kg   LMP 06/28/2020   SpO2 100%   BMI 29.76 kg/m  or  Vitals:   03/29/21 0200 03/29/21 0230 03/29/21 0300 03/29/21 0451  BP: (!) 99/55 101/60 100/61 115/70  Pulse: 68 64 61 68  Resp:      Temp:      TempSrc:      SpO2:      Weight:      Height:       Dilation: 5 Effacement (%): 70 Station: -3 Presentation: Vertex Exam by:: Dr. [email or web address] FHT: baseline rate 145, moderate varibility, positive acel, no decel Toco: Every 2 3-4 minutes  Labs: Lab Results  Component Value Date   WBC 5.9 03/28/2021   HGB 11.6 (L) 03/28/2021   HCT 37.0 03/28/2021   MCV 84.1 03/28/2021   PLT 268 03/28/2021    Patient Active Problem List   Diagnosis Date Noted   Encounter for elective induction of labor 03/28/2021   Susceptible to Varicella (non-immune), currently pregnant in third trimester 01/28/2021   History of pregnancy loss in prior pregnancy, currently pregnant 01/22/2021   History of shoulder dystocia in prior pregnancy 01/22/2021   Limited prenatal care in second trimester 11/26/2020   Supervision of normal intrauterine pregnancy in multigravida 11/26/2020   Homeless 11/26/2020   BV (bacterial vaginosis) 05/07/2020   Pseudoseizure     Assessment / Plan: 20 y.o. G3P1011 at [redacted]w[redacted]d here for eIOL.  Labor: Slowly progressing.  Cervical exam with little change from previous check. Fetal hand palpated initially which retracted with contraction. Will continue pitocin and continue to monitor. Consider AROM at next check.  Fetal Wellbeing: Category 1, reassuring Pain Control:Would like to deliver without pain medications but is open to epidural. As needed epidural order in place.  Anticipated  MOD: Vaginal #ID: GBS positive s/p PCN x2.  Also chlamydia positive and has received 1 g azithromycin.   #History VAVD w/ shoulder dystocia: Plan appropriately for delivery #Varicella nonimmune: Will need postpartum vaccination #Housing instability: Patient will need social work consult after delivery   [redacted]w[redacted]d, MD  PGY-3, [facility]  03/29/2021, 4:59 AM

## 2021-03-29 NOTE — Progress Notes (Signed)
Edinburgh score 15-pastoral consult triggered however patient declines services at this time.

## 2021-03-29 NOTE — Progress Notes (Signed)
LABOR PROGRESS NOTE  Kristin Hunt is a 20 y.o. G3P1011 at [redacted]w[redacted]d  admitted for eIOL.  Subjective: Patient is comfortable at this time.  Walking around the room in no acute distress.  Reports that she cannot feel her contractions.  Objective: BP 116/79   Pulse 94   Temp 98.1 F (36.7 C) (Oral)   Resp 17   Ht 5\' 3"  (1.6 m)   Wt 76.2 kg   LMP 06/28/2020   SpO2 100%   BMI 29.76 kg/m  or  Vitals:   03/28/21 1921 03/28/21 1926 03/28/21 1931 03/28/21 1936  BP:      Pulse:      Resp:      Temp:      TempSrc:      SpO2: 99% 100% 100% 100%  Weight:      Height:       Dilation: 5 Effacement (%): 70 Station: -2, -3 Presentation: Vertex Exam by:: Dr. [email or web address] FHT: baseline rate 145, moderate varibility, positive acel, no decel Toco: Every 2 3-4 minutes  Labs: Lab Results  Component Value Date   WBC 5.9 03/28/2021   HGB 11.6 (L) 03/28/2021   HCT 37.0 03/28/2021   MCV 84.1 03/28/2021   PLT 268 03/28/2021    Patient Active Problem List   Diagnosis Date Noted   Encounter for elective induction of labor 03/28/2021   Susceptible to Varicella (non-immune), currently pregnant in third trimester 01/28/2021   History of pregnancy loss in prior pregnancy, currently pregnant 01/22/2021   History of shoulder dystocia in prior pregnancy 01/22/2021   Limited prenatal care in second trimester 11/26/2020   Supervision of normal intrauterine pregnancy in multigravida 11/26/2020   Homeless 11/26/2020   BV (bacterial vaginosis) 05/07/2020   Pseudoseizure     Assessment / Plan: 20 y.o. G3P1011 at [redacted]w[redacted]d here for eIOL.  Labor: Slowly progressing.  Cervical exam showing some change but fetus not well applied to the cervix.  Patient is also not feeling her contractions very much.  Will initiate Pitocin 2x2 and repeat cervical exam in 4 hours. Fetal Wellbeing: Category 1, reassuring Pain Control: As needed epidural Anticipated MOD: Vaginal #ID: GBS positive s/p PCN x2.  Also chlamydia  positive and has received 1 g azithromycin.   #History VAVD w/ shoulder dystocia: Plan appropriately for delivery #Varicella nonimmune: Will need postpartum vaccination #Housing instability: Patient will need social work consult after delivery   [redacted]w[redacted]d, MD  PGY-3, [facility]  03/29/2021, 12:52 AM

## 2021-03-29 NOTE — Lactation Note (Addendum)
This note was copied from a baby's chart. Lactation Consultation Note  Patient Name: Boy Ahlivia Salahuddin QPRFF'M Date: 03/29/2021 Reason for consult: Initial assessment;Term Age:21 hours Per mom, she ate corn starch ( PICA) in her pregnancy was concern if it would affect infant LC advised mom not to continue eating corn starch due to it having no nutritional value.  Mom will focus on consuming iron rich foods with Vitamin C, per mom , she had hx of anemia.  LC entered the room, mom had recently finished breastfeeding infant for 30 minutes, per mom, she feels breastfeeding is going well. LC did not observe current latch and RN Randa Evens) assisted with latch. Per mom, she only felt tug no pain with latch, mom feels breastfeeding is doing well. LC discussed importance of maternal diet, maternal rest and hydration.  Mom requested manual breast pump for home use, LC explained and demonstrated how to use, mom fitted with 24 mm breast flange.  Mom made aware of O/P services, breastfeeding support groups, community resources, and our phone # for post-discharge questions.   Mom's plan:  1- Mom will continue to breastfeed infant according to feeding cues, 8 to 12+ times within 24 hours. 2- Parents will continue to do lots of skin to skin with infant. 3- Mom knows to call RN/LC if she has breastfeeding questions, concerns or needs further assistance with latching infant at the breast.   Maternal Data Has patient been taught Hand Expression?: Yes Does the patient have breastfeeding experience prior to this delivery?: Yes How long did the patient breastfeed?: Per mom, she breastfeed her 21 year old for 6 months  Feeding Mother's Current Feeding Choice: Breast Milk  LATCH Score                    Lactation Tools Discussed/Used    Interventions    Discharge Pump: Manual (Mom requested manual pump, mom has ordered DEBP but not received it yet.) WIC Program: Yes  Consult Status Consult  Status: Follow-up Date: 03/30/21 Follow-up type: In-patient    Danelle Earthly 03/29/2021, 11:50 PM

## 2021-03-29 NOTE — Progress Notes (Signed)
Kristin Hunt is a 20 y.o. P3A2505 at [redacted]w[redacted]d.  Subjective: Much more uncomfortable. Coping well.   Objective: BP 120/61   Pulse 76   Temp 98.4 F (36.9 C) (Oral)   Resp 18   Ht 5\' 3"  (1.6 m)   Wt 76.2 kg   LMP 06/28/2020   SpO2 100%   Breastfeeding Unknown   BMI 29.76 kg/m    FHT:  FHR: 125 bpm, variability: mod,  accelerations:  15x15,  decelerations:  none UC:   Q 2-3 minutes, mod-strong Dilation: 7.5 Effacement (%): 80 Station: Plus -1 Presentation: Vertex Exam by:: [email or web address], CNM AROM forebag, mod amount of light MSF.   Labs: NA  Assessment / Plan: [redacted]w[redacted]d week IUP Labor: transition Fetal Wellbeing:  Category I Pain Control:  Comfort measures Anticipated MOD:  SVD  [redacted]w[redacted]d, CNM 03/29/2021 2:52 PM

## 2021-03-30 ENCOUNTER — Encounter (HOSPITAL_COMMUNITY): Payer: Self-pay | Admitting: Obstetrics & Gynecology

## 2021-03-30 DIAGNOSIS — O99820 Streptococcus B carrier state complicating pregnancy: Secondary | ICD-10-CM | POA: Diagnosis not present

## 2021-03-30 DIAGNOSIS — O9832 Other infections with a predominantly sexual mode of transmission complicating childbirth: Secondary | ICD-10-CM | POA: Diagnosis not present

## 2021-03-30 DIAGNOSIS — Z3A38 38 weeks gestation of pregnancy: Secondary | ICD-10-CM | POA: Diagnosis not present

## 2021-03-30 LAB — CBC
HCT: 32.6 % — ABNORMAL LOW (ref 36.0–46.0)
Hemoglobin: 10.5 g/dL — ABNORMAL LOW (ref 12.0–15.0)
MCH: 26.4 pg (ref 26.0–34.0)
MCHC: 32.2 g/dL (ref 30.0–36.0)
MCV: 82.1 fL (ref 80.0–100.0)
Platelets: 256 10*3/uL (ref 150–400)
RBC: 3.97 MIL/uL (ref 3.87–5.11)
RDW: 13.1 % (ref 11.5–15.5)
WBC: 13.6 10*3/uL — ABNORMAL HIGH (ref 4.0–10.5)
nRBC: 0 % (ref 0.0–0.2)

## 2021-03-30 NOTE — Lactation Note (Signed)
This note was copied from a baby's chart. Lactation Consultation Note  Patient Name: Kristin Hunt ASNKN'L Date: 03/30/2021 Reason for consult: Follow-up assessment;Mother's request;Difficult latch;Term Age:21 hours  Infant not able to wake since circ. LC did some suck training to help him bring his chin down. Infant given 6 ml of colostrum on spoon and latched in football with signs of milk transfer.   Plan 1. To feed based on cues 8-12x 24 hr period. Mom to offer breasts and look for signs of milk transfer.  2. If unable to latch, Mom to offer EBM via spoon All questions answered at the end of the visit.   Maternal Data    Feeding Mother's Current Feeding Choice: Breast Milk  LATCH Score Latch: Repeated attempts needed to sustain latch, nipple held in mouth throughout feeding, stimulation needed to elicit sucking reflex.  Audible Swallowing: Spontaneous and intermittent  Type of Nipple: Everted at rest and after stimulation  Comfort (Breast/Nipple): Soft / non-tender  Hold (Positioning): Assistance needed to correctly position infant at breast and maintain latch.  LATCH Score: 8   Lactation Tools Discussed/Used    Interventions Interventions: Breast feeding basics reviewed;Support pillows;Education;Assisted with latch;Position options;Skin to skin;Expressed milk;Breast massage;Breast compression;Adjust position;Infant Driven Feeding Algorithm education;Coconut oil  Discharge    Consult Status Consult Status: Follow-up Date: 03/31/21 Follow-up type: In-patient    Anabell Swint  Nicholson-Springer 03/30/2021, 6:52 PM

## 2021-03-30 NOTE — Progress Notes (Signed)
POSTPARTUM PROGRESS NOTE  Post Partum Day 1  Subjective:  Kristin Hunt is a 20 y.o. [id number] s/p VD at [redacted]w[redacted]d.  She reports she is doing well. No acute events overnight. She denies any problems with ambulating, voiding or po intake. Denies nausea or vomiting.  Pain is well controlled.  Lochia is mild.  Objective: Blood pressure 107/66, pulse 60, temperature 98.4 F (36.9 C), temperature source Oral, resp. rate 18, height 5\' 3"  (1.6 m), weight 76.2 kg, last menstrual period 06/28/2020, SpO2 100 %, unknown if currently breastfeeding.  Physical Exam:  General: alert, cooperative and no distress Chest: no respiratory distress Heart:regular rate, distal pulses intact Uterine Fundus: firm, appropriately tender Extremities: minimal edema Skin: warm, dry  Recent Labs    03/28/21 2000 03/30/21 0517  HGB 11.6* 10.5*  HCT 37.0 32.6*    Assessment/Plan: Kristin Hunt is a 20 y.o. 36 s/p VD at [redacted]w[redacted]d   PPD#1 - Doing well  Routine postpartum care  Consented patient for neonatal circumcision.   Housing instability: SW consult in place  Chlamydia infection: Treated prior to delivery, will need TOC at postpartum visit.   Contraception: depo Feeding: breast Dispo: Plan for discharge tomorrow.   LOS: 2 days   [redacted]w[redacted]d, DO  OB Fellow  03/30/2021, 8:51 AM

## 2021-03-30 NOTE — Clinical Social Work Maternal (Signed)
CLINICAL SOCIAL Hunt MATERNAL/CHILD NOTE  Patient Details  Name: Kristin Hunt MRN: [id number] Date of Birth: [birth year]  Date:  03/30/2021  Clinical Social Worker Initiating Note:  Kristin Hunt Date/Time: Initiated:  03/30/21/1117     Child's Name:  Kristin Hunt   Biological Parents:  Mother, Father (FOB is Kristin Hunt (02/01/2000) [phone])   Need for Interpreter:  None   Reason for Referral:  Other (Comment), Homelessness (Edinburgh score 15)   Address:  [address] [place] [address]    Phone number:  [phone] (home)     Additional phone number: FOB's number is [phone]  Household Members/Support Persons (HM/SP):   Household Member/Support Person 1   HM/SP Name Relationship DOB or Age  HM/SP -1 Kristin Hunt son 01/04/2018  HM/SP -2        HM/SP -3        HM/SP -4        HM/SP -5        HM/SP -6        HM/SP -7        HM/SP -8          Natural Supports (not living in the home):  Immediate Family, Parent, Friends (Per MOB, FOB's family will also provide supports.)   Professional Supports: None   Employment: Unemployed   Type of Hunt:     Education:  Programmer, systems   Homebound arranged:    Museum/gallery curator Resources:  Medicaid   Other Resources:  Physicist, medical  , [place] Considerations Which May Impact Care:  Per Kristin Hunt Presenter, broadcasting, MOB is Kristin Hunt.   Strengths:  Home prepared for child  , Pediatrician chosen, Ability to meet basic needs     Psychotropic Medications:         Pediatrician:    [facility] area  Pediatrician List:   [place]  [place]      Pediatrician Fax Number:    Risk Factors/Current Problems:  Other (Comment) (Homelessness but working with Kristin Hunt.)   Cognitive State:  Alert  , Insightful  , Linear Thinking     Mood/Affect:  Interested  ,  Comfortable  , Happy  , Relaxed     CSW Assessment: CSW met with MOB in room 515 to complete an assessment for Edinburgh score of 15 and homelessness.  When CSW arrived, MOB was holding infant while engaging in a video chat with her oldest son.  CSW offered to return at a later time and MOB declined. MOB was polite, easy to engage and receptive to meeting with CSW. MOB and infant appeared to be happy and comfortable.   CSW asked about MOB's housing status and MOB reported that she is currently residing at [facility] [employer]. Per MOB, MOB has been residing there since August 2022.  MOB shared that she aged out of foster and prior to aging out she provided an apartment.  MOB shared her story on how she was evicted from her apartment and became homeless.  MOB communicated that her oldest son resides with her father and CPS was not involved. Per MOB she continues to have full custody of her son and placed her son with her father for stability. MOB voiced that she plans to stay engaged with Kristin Hunt for 12-18 in order for them to  assist her with securing safe and stable housing.  MOB also shared that the Hunt has also assisted MOB with obtaining all essential items to care for infant.   CSW reviewed MOB's Edinburgh results.  MOB attributed her emotional responses to not having her "Own place."  MOB denied PMAD symptoms with her oldest son and was open to resources for PMADS. CSW provided education regarding Baby Blues vs PMADs and provided MOB with resources for mental health follow up.  CSW encouraged MOB to evaluate her mental health throughout the Kristin period with the use of the New Mom Checklist developed by Kristin Hunt as well as the Kristin Hunt and notify a medical professional if symptoms arise.  MOB presented with insight and awareness and denied SI, HI, and DV when assessed for safety.  MOB reported feeling comfortable seeking help if help is  needed.   CSW Plan/Description:  Psychosocial Support and Ongoing Assessment of Needs, Sudden Infant Death Syndrome (SIDS) Education, Perinatal Mood and Anxiety Disorder (PMADs) Education, Other Patient/Family Education, Other Information/Referral to Kristin Hunt, MSW, Kristin Hunt [phone]  Kristin Nanas, LCSW 03/30/2021, 11:22 AM

## 2021-03-31 ENCOUNTER — Other Ambulatory Visit (HOSPITAL_COMMUNITY): Payer: Self-pay

## 2021-03-31 DIAGNOSIS — Z3A38 38 weeks gestation of pregnancy: Secondary | ICD-10-CM | POA: Diagnosis not present

## 2021-03-31 DIAGNOSIS — O9832 Other infections with a predominantly sexual mode of transmission complicating childbirth: Secondary | ICD-10-CM | POA: Diagnosis not present

## 2021-03-31 DIAGNOSIS — O99820 Streptococcus B carrier state complicating pregnancy: Secondary | ICD-10-CM | POA: Diagnosis not present

## 2021-03-31 MED ORDER — ACETAMINOPHEN 325 MG PO TABS: 650.0000 mg | ORAL_TABLET | ORAL | Status: DC | PRN

## 2021-03-31 MED ORDER — IBUPROFEN 600 MG PO TABS
600.0000 mg | ORAL_TABLET | Freq: Four times a day (QID) | ORAL | 0 refills | Status: DC
  Filled 2021-03-31: qty 30, 8d supply, fill #0

## 2021-03-31 MED ORDER — SERTRALINE HCL 25 MG PO TABS
25.0000 mg | ORAL_TABLET | Freq: Every day | ORAL | 2 refills | Status: DC
  Filled 2021-03-31: qty 30, 30d supply, fill #0

## 2021-03-31 NOTE — Progress Notes (Signed)
Patient told to make an appointment with Behavior Health. Social work stated she can make her appointment.

## 2021-04-09 ENCOUNTER — Telehealth: Payer: Self-pay | Admitting: Clinical

## 2021-04-09 NOTE — Telephone Encounter (Signed)
Left HIPPA-compliant message to call back [facility] from [facility] at  [phone] (Jamie's office).   

## 2021-04-11 ENCOUNTER — Telehealth (HOSPITAL_COMMUNITY): Payer: Self-pay

## 2021-04-11 DIAGNOSIS — Z1331 Encounter for screening for depression: Secondary | ICD-10-CM

## 2021-04-11 NOTE — Telephone Encounter (Signed)
"  I'm doing pretty good. I have some pain every now and then in my perineum. I've been taking Motrin for it and it helps." RN reviewed peri care with patient. Patient has no other questions or concerns about her healing.  "He has some white stuff on his tongue. When I wipe it with a washcloth it is still there." RN encouraged patient to reach out to her pediatrician about her concern. "He is eating and gaining weight. He sleeps in a bassinet." RN reviewed ABC's of safe sleep with patient. Patient declines any questions or concerns about baby.  EPDS score is 12. Referral placed for integrated behavioral health. RN told patient about Maternal Mental Health Resources ([facility], National Maternal Mental Health Hotline, Postpartum Support International). Also told patient about [facility] support group offerings. Will e-mail these resources to patient as well.   Marcelino Duster [facility] 04/11/2021,1047

## 2021-04-13 ENCOUNTER — Telehealth: Payer: Self-pay | Admitting: Clinical

## 2021-04-13 NOTE — Telephone Encounter (Signed)
Left HIPPA-compliant message to call back Kristin Hunt from [facility] [employer] [facility] at  [phone] [facility] office); left MyChart message for pt.

## 2021-04-14 ENCOUNTER — Ambulatory Visit (INDEPENDENT_AMBULATORY_CARE_PROVIDER_SITE_OTHER): Payer: Medicaid Other | Admitting: Clinical

## 2021-04-14 DIAGNOSIS — F4323 Adjustment disorder with mixed anxiety and depressed mood: Secondary | ICD-10-CM

## 2021-04-14 DIAGNOSIS — Z658 Other specified problems related to psychosocial circumstances: Secondary | ICD-10-CM

## 2021-04-14 NOTE — BH Specialist Note (Signed)
Integrated Behavioral Health via Telemedicine Visit  04/14/2021 Kristin Hunt [id number]  Number of Integrated Behavioral Health visits: 1 Session Start time: 9:18 Session End time: 10:13 Total time: 29   Referring Provider: Scheryl Darter, MD Patient/Family location: Home [facility] Provider location: [facility]  All persons participating in visit: [name] and [facility] Kristin Hunt   Types of Service: Individual psychotherapy and Video visit  I connected with Kristin Hunt and/or Kristin Hunt  n/a  via  Telephone or Video Enabled Telemedicine Application  (Video is Caregility application) and verified that I am speaking with the correct person using two identifiers. Discussed confidentiality: Yes   I discussed the limitations of telemedicine and the availability of in person appointments.  Discussed there is a possibility of technology failure and discussed alternative modes of communication if that failure occurs.  I discussed that engaging in this telemedicine visit, they consent to the provision of behavioral healthcare and the services will be billed under their insurance.  Patient and/or legal guardian expressed understanding and consented to Telemedicine visit: Yes   Presenting Concerns: Patient and/or family reports the following symptoms/concerns: Depressed, anxious, with current life stress triggering feelings regarding past grief and trauma; pt's goals are to move into her own housing and go back to school, with long-term goal of becoming pediatric physician.  Duration of problem: Increase postpartum; ongoing; Severity of problem: moderate  Patient and/or Family's Strengths/Protective Factors: Social connections, Concrete supports in place (healthy food, safe environments, etc.), and Sense of purpose  Goals Addressed: Patient will:  Reduce symptoms of: anxiety, depression, and stress   Increase knowledge and/or  ability of: stress reduction   Demonstrate ability to: Increase healthy adjustment to current life circumstances and Increase motivation to adhere to plan of care  Progress towards Goals: Ongoing  Interventions: Interventions utilized:  Solution-Focused Strategies, Psychoeducation and/or Health Education, and Link to [employer] Assessments completed:  Not given today  Patient and/or Family Response: Pt agrees with treatment plan  Assessment: Patient currently experiencing Adjustment disorder with mixed anxious and depressed mood.   Patient may benefit from psychoeducation and brief therapeutic interventions regarding coping with symptoms of anxiety, depression, stress .  Plan: Follow up with behavioral health clinician on : Two weeks Behavioral recommendations:  -Continue taking Zoloft as prescribed -Continue working with staff at [employer] with [employer] (Consider any additional housing/transportation resources on After Visit Summary) -Continue setting healthy boundaries with difficult family members -Consider inquiry into [employer] program of choice: What are requirements to enroll?  -Begin Worry Time strategy; practice daily for two weeks (set time on phone for beginning and ending time daily) Referral(s): Integrated Art gallery manager (In Clinic) and [employer]:  Housing and Transportation  I discussed the assessment and treatment plan with the patient and/or parent/guardian. They were provided an opportunity to ask questions and all were answered. They agreed with the plan and demonstrated an understanding of the instructions.   They were advised to call back or seek an in-person evaluation if the symptoms worsen or if the condition fails to improve as anticipated.  Rae Lips, LCSW  Depression screen [facility] 2/9 03/25/2021 03/17/2021 03/10/2021 02/19/2021 02/10/2021  Decreased Interest 1 1 1 2  0  Down, Depressed, Hopeless 1 1  1 3  0  PHQ - 2 Score 2 2 2 5  0  Altered sleeping 2 1 1 3  0  Tired, decreased energy 1 1 1 3  0  Change in appetite 0 0 1 2 0  Feeling bad or failure about yourself  0 1 1 1  0  Trouble concentrating 0 0 1 2 0  Moving slowly or fidgety/restless 0 0 0 0 0  Suicidal thoughts 0 0 0 0 0  PHQ-9 Score 5 5 7 16  0  Difficult doing work/chores - - - - Not difficult at all  Some recent data might be hidden   Postnatal Depression Scale Screening Tool 04/11/2021 03/29/2021 02/01/2018 01/04/2018  I have been able to laugh and see the funny side of things. 0 1 0 2  I have looked forward with enjoyment to things. 1 1 0 0  I have blamed myself unnecessarily when things went wrong. 3 2 0 1  I have been anxious or worried for no good reason. 2 3 0 0  I have felt scared or panicky for no good reason. 2 1 1  0  Things have been getting on top of me. 1 2 1 1   I have been so unhappy that I have had difficulty sleeping. 2 2 3  0  I have felt sad or miserable. 1 2 1  0  I have been so unhappy that I have been crying. 0 1 0 1  The thought of harming myself has occurred to me. 0 0 0 0  Edinburgh Postnatal Depression Scale Total 12 15 6  5

## 2021-04-14 NOTE — Patient Instructions (Addendum)
Center for [facility] at [facility] [address] [place], [place] [address] [phone] (main office) [phone] (Fain Francis's office)  New mom support:  www.conehealthybaby.com www.postpartum.net   [employer] Dance movement psychotherapist): Look up Mudlogger I Research scientist (physical sciences)" for International Paper Program UlcerTests.com.cy  Housing Resources                    [facility] [employer] (serves [place], [place], [place], [place], [place], [place], [place], [place], [place], [place], [place], [place], and [place] counties) [address] [place], [place] [address] [phone] DeveloperU.ch  **Rental assistance, Home Rehabilitation,Weatherization Assistance Program, Chief Financial Officer, Housing Voucher Program  [employer] for Housing and [employer]Life Studies: Proofreader Resources to residents of [place], [place], and [place] Make sure you have your documents ready, including:  (Household income verification: 2 months pay stubs, unemployment/social security award letter, statement of no income for all household members over 85) Photo ID for all household members over 18 Utility Bill/Rent Ledger/Lease: must show past due amount for utilities/rent, or the rental agreement if rent is current 2. Start your application online or by paper (in [place] or [place]) at:     https://www.castaneda.[email or web address]  3. Once you have completed the online application, you will get an email confirmation message from the county. Expect to hear back by phone or email at least 6-10 weeks from submitting your application.  4. While you wait:  Call [phone] to check in on your application Let your landlord know that you've applied. Your landlord will be asked to submit documents (W-9) during this application process. Payments will be made directly to the landlord/property  management company and utility company Rent or utility assistance for [employer], [place], and [place] Apply at https://rb.gy/dvxbfv Questions? Call or email Renee at [phone] or [email or web address]   Eviction Mediation Program: The HOPE Program Https://www.rebuild.BedroomRental.com.cy HOPE Progam serves low-income renters in [address] [place] counties, defined as less than or equal to 80% of the area median income for the county where the renter lives. In the following 12 counties, you should apply to your local rent and utility assistance program INSTEAD OF the HOPE Program: [place], [place], [place], [place], [place], [place], [place], [place], [place], [place], [place], [place]  If you live outside of [place], contact HOPE call center at [phone] to talk to a Program Representative Monday-Friday, 8am-5pm Note that Native American tribes also received federal funding for rent and utility assistance programs. Recognized members of the following tribes will be served by programs managed by tribal governments, including: [place] Indians, [place], [place], [place] of [place] and [facility] Eviction Mediation Coordinator, [place], [phone] [email or web address] .[employer], [place], [phone] [email or web address]   Housing Resources [facility] [address], [place], [place] [address] [phone] www.gha-Mon[place].[facility] [address] [place], [place] [address] [phone] PhoneCaptions.ch **Programs include: Hospital doctor and Housing Counseling, Healthy Radiographer, therapeutic, Homeless Prevention and Housing Assistance  Government [facility] [address], [place], [place] [address] [phone] www.PaintingEmporium.co.za **housing applications/recertification; tax  payment relief/exemption under specific qualifications  [facility] [address], [place], [place] [address] www.onlinegreensboro.com/~maryshouse **transitional housing for women in recovery who have minor children or are pregnant  [facility] [address] [place], [place], [place] [address] https://johnson-smith.net/  **emergency shelter and support services for families facing homelessness  [facility] [address] [place], [place] [address] [phone] www.youthfocus.org **transitional housing to pregnant women; emergency housing for youth who have run away, are experiencing a family crisis, are victims of abuse or neglect, or are homeless  [facility] [address], [place], [place] [address] [phone] ircgso.org **Drop-in center for people experiencing homelessness; overnight warming center when temperature is 25 degrees or below  Re-Entry Staffing [address] [place], [place], [place] [address] [phone] https://reentrystaffingagency.org/ **help with affordable housing to people experiencing homelessness or unemployment due to incarceration  [facility] [address], [place], [place] [address] [phone] www.greensborourbanministry.org  **emergency and transitional housing, rent/mortgage assistance, utility assistance  [facility] [address] [place], [place] [address] [phone] www.salvationarmyofgreensboro.org **emergency and transitional housing  Habitat for [employer] [address], [place], [place] [address] [phone] Www.habitatgreensboro.[facility]   [employer] [address], [place], [place] [address] [phone] https://chshousing.org **Home Ownership/Affordable Housing Program and [facility]  Housing Consultants Group [address], [place], [place] [address] [phone] PaidValue.com.cy **home buyer education courses, foreclosure prevention  [facility] [address], [place], [place] [address] [phone] DefMagazine.is **Environmental Exposure Assessment (investigation of homes where either children or pregnant women with a confirmed elevated blood lead level reside)  [facility] [address] Nat Math [place], [place] [address] [phone] ShowReturn.ca **Home Expense Assistance/Repairs Program; offers home accessibility updates, such as ramps or bars in the bathroom  Self[facility]Help [facility] [address], [place], [place] [address] [phone] https://www.self-help.org/locations/[place]-branch **Offers credit-building and banking services to people unable to use traditional banking   Housing Resources [facility]  Housing Authority- [place] [facility] [address] Cinda Quest [place], [place] [address] [phone] ChatRepair.pl   [facility] [address] [place], [place] [address] [phone]  WrestlingMonthly.pl **housing applications/recertification, emergency and transitional housing  Open [employer] of [employer] [address], [place], [place] [address] [phone] www.odm-hp.org  **emergency and permanent housing; rent/mortgage payment assistance  Habitat for [employer], [place] and [place] [address], [place], [place] [address] [phone] https://russell-walls.com/  Family Services of the [place], [employer] [address] [place], [place], [place] [address] www.familyservice-piedmont.org **emergency shelter for victims of domestic violence and sexual assault  [facility] [address] [place], [place] [address] [phone] www.senior-resources-guilford.org **Home  expense assistance/repairs for older adults  Housing Resources [facility] [address] [place], [place] [address] [phone] http://cohen-reilly.biz/  **May offer help with minor housing repairs  [facility] [address] [place], [place] [address] [phone] **emergency housing for victims of domestic violence  Housing Resources [place], [place], [place] [facility])  [facility] [address], [place], [place] [address] [phone] www.newrha.[facility] [address] [place], [place] [address] [phone]  [facility] [address], [place], [place] [address] [phone] www.co.[place].us **Housing applications/recertification; tax payment relief/exemption w specific qualifications  [facility] Help for Homeless [address] [place], [place] [address] [phone] Http://www.rchelpforhomeless.org/HOME_PAGE.html  HELP, Incorporated [facility] [address], [place], [place] [address] [phone] 24 Hour Crisis Line [phone] Hours of Operation: Monday-Friday, 8:30am-5:00pm http://helpincorporated.org **Includes emergency housing for victims of domestic violence  Transportation Resources [place] [employer] (GTA) [address] [place], [place], [place] [address] https://www.[place].gov/departments/transportation/gdot-divisions/-transit-agency-public-transportation-division  Fixed-route bus services, including regional fare cards for PART, [place], [place], and WSTA buses.  Reduced fare bus ID's available for Medicaid, Medicare, and "orange card" recipients.  SCAT offers curb-to-curb and door-to-door bus services for people with disabilities who are unable to use a fixed-bus route; also offers a shared-ride program.    Helpful tips:  -Routes available online and physical maps available at the main bus hub lobby (each for a specific route) -Smartphone directions often include bus routes (see the "bus" icon, next to the "car" and "walk" icons) -Routes differ on weekends, evenings and holidays, so plan ahead!  -If you have Medicaid, Medicare, or orange card, plan to obtain a reduced-fare ID to save 50% on rides. Check days and times to obtain an ID, and bring all necessary documents.   [employer] [place]) [address] [place], [place]. [address], [place], [place] [address] [phone] SpotApps.nl **Fixed-bus route services, and demand response bus service for older adults  Department of Social [facility] [address] [place], [place] [address] [phone] www.MysterySinger.com.cy **Medicaid transportation is available to [facility] recipients who need assistance getting to [facility] medical appointments and providers  [facility] [address] [place] [address], [place], [place] [address] www.cjmedicaltransportation.com  ** Offers non-emergency transportation for medical appointments  [employer]els [address] [place], [place] [address] [phone] www.wheels4hope.org **REFERRAL NEEDED by specific agencies (see website), after meeting specified criteria only  [employer] for [employer]) [address], [place], [place] [address] [phone]  BuyingShow.es  *Regional fixed-bus routes between counties (example: [place] to [place]) and [employer] of [place]  [address], [place], [place] [address] [phone] Http://senior-resources-guilford.org  Museum/gallery exhibitions officer available (call or see website for details)  Senior Adults [facility] [address] [place], [place] [address] [phone] www.senioradults.org  **Ride coordination

## 2021-04-17 ENCOUNTER — Other Ambulatory Visit (HOSPITAL_COMMUNITY): Payer: Self-pay

## 2021-04-20 NOTE — BH Specialist Note (Signed)
Pt did not arrive to video visit and did not answer the phone; Left HIPPA-compliant message to call back Jacinda Kanady from [facility] at  [phone] (Margarie Mcguirt's office).  ?; left MyChart message for patient.  ? ?

## 2021-04-28 ENCOUNTER — Ambulatory Visit (INDEPENDENT_AMBULATORY_CARE_PROVIDER_SITE_OTHER): Payer: Medicaid Other

## 2021-04-28 ENCOUNTER — Ambulatory Visit (INDEPENDENT_AMBULATORY_CARE_PROVIDER_SITE_OTHER): Payer: Medicaid Other | Admitting: Family Medicine

## 2021-04-28 ENCOUNTER — Other Ambulatory Visit: Payer: Self-pay

## 2021-04-28 ENCOUNTER — Ambulatory Visit: Payer: Medicaid Other | Admitting: Clinical

## 2021-04-28 VITALS — BP 114/75 | HR 67 | Wt 156.8 lb

## 2021-04-28 DIAGNOSIS — F32A Depression, unspecified: Secondary | ICD-10-CM | POA: Diagnosis not present

## 2021-04-28 DIAGNOSIS — Z30019 Encounter for initial prescription of contraceptives, unspecified: Secondary | ICD-10-CM

## 2021-04-28 DIAGNOSIS — Z5900 Homelessness unspecified: Secondary | ICD-10-CM | POA: Diagnosis not present

## 2021-04-28 DIAGNOSIS — Z91199 Patient's noncompliance with other medical treatment and regimen due to unspecified reason: Secondary | ICD-10-CM

## 2021-04-28 DIAGNOSIS — F419 Anxiety disorder, unspecified: Secondary | ICD-10-CM

## 2021-04-28 DIAGNOSIS — Z23 Encounter for immunization: Secondary | ICD-10-CM

## 2021-04-28 DIAGNOSIS — Z30013 Encounter for initial prescription of injectable contraceptive: Secondary | ICD-10-CM | POA: Diagnosis not present

## 2021-04-28 DIAGNOSIS — H538 Other visual disturbances: Secondary | ICD-10-CM

## 2021-04-28 LAB — POCT URINE PREGNANCY: Preg Test, Ur: NEGATIVE

## 2021-04-28 MED ORDER — MEDROXYPROGESTERONE ACETATE 150 MG/ML IM SUSP
150.0000 mg | Freq: Once | INTRAMUSCULAR | Status: AC
  Administered 2021-04-28: 150 mg via INTRAMUSCULAR

## 2021-04-28 MED ORDER — SERTRALINE HCL 50 MG PO TABS: 50.0000 mg | ORAL_TABLET | Freq: Every day | ORAL | 0 refills | Status: DC

## 2021-04-28 MED ORDER — MEDROXYPROGESTERONE ACETATE 150 MG/ML IM SUSP
150.0000 mg | Freq: Once | INTRAMUSCULAR | Status: AC
  Administered 2022-01-07: 150 mg via INTRAMUSCULAR

## 2021-04-28 MED ORDER — IBUPROFEN 600 MG PO TABS: 600.0000 mg | ORAL_TABLET | Freq: Four times a day (QID) | ORAL | 0 refills | Status: DC

## 2021-04-28 NOTE — Patient Instructions (Signed)
It was great seeing you today!  Please check-out at the front desk before leaving the clinic. I'd like to see you back in 4 weeks but if you need to be seen earlier than that for any new issues we're happy to fit you in, just give [place] a call!  Visit Remembers: - Stop by the pharmacy to pick up your prescriptions  - Continue to work on your healthy eating habits and incorporating exercise into your daily life.  - Medicine Changes: Increase Zoloft 25 mg to 50 mg daily. You can take 2-tablets to equal 50mg  or pick up the new 50 mg tablets from your pharmacy.  - To Do: send me a Mychart message to let me know how you're feeling with the new dose in 2-3 weeks.  - Call one of the eye doctors on the list provided.  Feel free to call with any questions or concerns at any time, at [phone].   Take care,  Dr. [phone] [facility]

## 2021-04-28 NOTE — Assessment & Plan Note (Addendum)
Living at the [facility] house

## 2021-04-28 NOTE — Progress Notes (Signed)
   SUBJECTIVE:   CHIEF COMPLAINT / HPI:   Chief Complaint  Patient presents with   Contraception   Postpartum Care     Kristin Hunt is a 20 y.o. female here for depression follow up and postpartum care. Patient states her bleeding stopped in the beginning of November.  She is not sexually active currently.  Would like to start Depo perception.  She gave birth 03/28/2021 healthy baby boy.  Her son, Ernest Pine) is now 70 month old.  She lives at the [facility] for [facility].   Note she has not been feeling like herself. Has brain fog.  Spends most of her day taking care of her baby boy. "Things have been stressful."  She is about to start working at a new job where she can work from home. Taking Zoloft daily as prescribed. She gets a tingling sensation that comes over her which lasts a minute or two.Tells herself to relax and calm herself down. Support system includes Daisha, sister; Dorathy Daft, sister, those at the maternity house.  Denies history of suicidal ideations or suicide attempt.  No current SI or HI.   PERTINENT  PMH / PSH: reviewed and updated as appropriate   OBJECTIVE:   BP 114/75   Pulse 67   Wt 156 lb 12.8 oz (71.1 kg)   LMP 06/28/2020   BMI 27.78 kg/m    GEN: well appearing female, in no acute distress, infant in car seat   EYES: Vision: grossly intact 20/15, 20/30 both 20/20 CV: regular rate and rhythm, no murmurs appreciated  RESP: no increased work of breathing, clear to ascultation bilaterally SKIN: warm, dry PSYCH: Normal affect, appropriate speech and behavior       ASSESSMENT/PLAN:   Blurry Vision  Vision grossly intact. Referral to ophthalmology placed.   Provided area optometrist and ophthalmologists.   Homeless Living at the [facility]ity house  Anxiety and depression Started on Zoloft 25 mg however pt is still symptomatic. Increase to Zoloft 50 mg daily. Follow up in 2 weeks as scheduled.      Edinburgh Postnatal  Depression Scale Screening Tool 04/28/2021 04/11/2021 03/29/2021 02/01/2018 01/04/2018  I have been able to laugh and see the funny side of things. 2 0 1 0 2  I have looked forward with enjoyment to things. 2 1 1  0 0  I have blamed myself unnecessarily when things went wrong. 2 3 2  0 1  I have been anxious or worried for no good reason. 3 2 3  0 0  I have felt scared or panicky for no good reason. 3 2 1 1  0  Things have been getting on top of me. 1 1 2 1 1   I have been so unhappy that I have had difficulty sleeping. 0 2 2 3  0  I have felt sad or miserable. 2 1 2 1  0  I have been so unhappy that I have been crying. 1 0 1 0 1  The thought of harming myself has occurred to me. 0 0 0 0 0  Edinburgh Postnatal Depression Scale Total 16 12 15 6 5       , DO PGY-3, [facility] 04/28/2021

## 2021-04-30 ENCOUNTER — Telehealth: Payer: Self-pay

## 2021-04-30 DIAGNOSIS — F32A Depression, unspecified: Secondary | ICD-10-CM | POA: Insufficient documentation

## 2021-04-30 NOTE — Assessment & Plan Note (Signed)
Started on Zoloft 25 mg however pt is still symptomatic. Increase to Zoloft 50 mg daily. Follow up in 2 weeks as scheduled.

## 2021-04-30 NOTE — Telephone Encounter (Signed)
Received phone call from Lynden Ang [facility] nurse regarding patient having elevated [place]. Patient scored a 19 at today's visit. She reports patient stating "feeling sad and overwhelmed."   Patient does not have any current thoughts of self harm or harm to baby.   Patient lives in [facility] and staff are aware of elevated screening. Patient will inform staff if she begins to have thoughts of harming self or baby.   Requesting referral for counseling.   Please advise.   Veronda Prude, RN

## 2021-05-01 ENCOUNTER — Other Ambulatory Visit: Payer: Self-pay | Admitting: Family Medicine

## 2021-05-01 NOTE — Progress Notes (Unsigned)
   SUBJECTIVE:   CHIEF COMPLAINT / HPI:   No chief complaint on file.    Kristin Hunt is a 20 y.o. female here for ***   Pt reports ***    PERTINENT  PMH / PSH: reviewed and updated as appropriate   OBJECTIVE:   LMP 06/28/2020   ***  ASSESSMENT/PLAN:   No problem-specific Assessment & Plan notes found for this encounter.     Katha Cabal, DO PGY-3, [facility] 05/01/2021      {    This will disappear when note is signed, click to select method of visit    :1}

## 2021-05-14 ENCOUNTER — Ambulatory Visit: Payer: Medicaid Other | Admitting: Family Medicine

## 2021-06-24 ENCOUNTER — Telehealth: Payer: Self-pay

## 2021-06-24 NOTE — Telephone Encounter (Signed)
Patient calls nurse line reporting she is feeling good and would like to taper down Zoloft. Patient reports she is currently taking 50mg , however she would like to go down to 25mg . Patient reports she feels she is adjusting well and has no thoughts of harm to herself or others. Patient reports she does not feel anxious or irritable.   Patient scheduled for PCP apt on 1/30 to discuss. However, patient would like to start the taper now.   Please advise on dropping down to 25mg  before apt.

## 2021-06-26 NOTE — Telephone Encounter (Signed)
Attempted to call patient back, however phone line rang once and disconnected x2.

## 2021-07-13 ENCOUNTER — Encounter: Payer: Self-pay | Admitting: Family Medicine

## 2021-07-13 ENCOUNTER — Other Ambulatory Visit: Payer: Self-pay

## 2021-07-13 ENCOUNTER — Ambulatory Visit (INDEPENDENT_AMBULATORY_CARE_PROVIDER_SITE_OTHER): Payer: Medicaid Other | Admitting: Family Medicine

## 2021-07-13 VITALS — BP 115/80 | HR 80 | Ht 63.0 in | Wt 170.1 lb

## 2021-07-13 DIAGNOSIS — Z5900 Homelessness unspecified: Secondary | ICD-10-CM

## 2021-07-13 DIAGNOSIS — F419 Anxiety disorder, unspecified: Secondary | ICD-10-CM | POA: Diagnosis not present

## 2021-07-13 DIAGNOSIS — F32A Depression, unspecified: Secondary | ICD-10-CM

## 2021-07-13 DIAGNOSIS — K5900 Constipation, unspecified: Secondary | ICD-10-CM

## 2021-07-13 DIAGNOSIS — Z3042 Encounter for surveillance of injectable contraceptive: Secondary | ICD-10-CM | POA: Diagnosis not present

## 2021-07-13 MED ORDER — POLYETHYLENE GLYCOL 3350 17 GM/SCOOP PO POWD: 17.0000 g | Freq: Every day | ORAL | 1 refills | Status: DC

## 2021-07-13 MED ORDER — POLYETHYLENE GLYCOL 3350 17 GM/SCOOP PO POWD: 0.5000 | Freq: Once | ORAL | 0 refills | Status: AC

## 2021-07-13 MED ORDER — SENNA 8.6 MG PO TABS: 1.0000 | ORAL_TABLET | Freq: Two times a day (BID) | ORAL | 0 refills | Status: DC | PRN

## 2021-07-13 NOTE — Progress Notes (Signed)
° °  SUBJECTIVE:   CHIEF COMPLAINT / HPI:   Chief Complaint  Patient presents with   Medication Management     Kristin Hunt is a 21 y.o. female here for her mental health.  Reports vast improvement on Zoloft 50 mg.  She would like to taper down.  She continues to bond well with her son.  She is living at the [facility] house.  Pt reports lower left of midline abdominal pain for the past 2 days.  Denies dysuria, vaginal discharge, vaginal bleeding, nausea, vomiting, diarrhea, fever.  Occasionally has lower back pain.  Pain is sharp and lasts about 1 minute.  Notes that she has a bowel movement every other day.   PERTINENT  PMH / PSH: reviewed and updated as appropriate   OBJECTIVE:   BP 115/80    Pulse 80    Ht 5\' 3"  (1.6 m)    Wt 170 lb 2 oz (77.2 kg)    SpO2 100%    BMI 30.14 kg/m   GEN: Smiling, well appearing female CV: regular rate and rhythm RESP: no increased work of breathing, clear to ascultation bilaterally ABD: Bowel sounds present. Soft, non-tender, non-distended, no palpable masses SKIN: warm, dry PSYCH: Normal affect, appropriate speech and behavior    ASSESSMENT/PLAN:   Anxiety and depression Stable. Taking Zoloft 50 mg however pt woukld like to decrease to 25 mg daily. Follow up in 3 weeks as scheduled.   Homeless Living at the My , maternity house.   Constipation Provided and reviewed constipation clean out and maintenance plan with patient. Discussed eating small meals frequently and increased fluid intake, as well as gradual increase in fiber intake with dried fruits, vegetables with skins, beans, whole grains and cereal. Goal ~30g of fiber per day. Encouraged drinking hot beverages and prune juice; add probiotic-containing foods like pasteurized yogurt and kefir; encourage increased physical activity.     Depo-Provera Discussed need to return prior to February 14 to receive Depo injection.  RN visit scheduled for tomorrow as this was the  earliest she could receive her next injection.    February 16, DO PGY-3, [facility] 07/13/2021

## 2021-07-13 NOTE — Patient Instructions (Addendum)
Cut your 50 mg Zoloft tablet in half. Follow up in office on 07/15/21 with nurse to get your Depo shot.  Follow up with me on 08/05/21 at 1:30 PM.   You are constipated and need help to clean out the large amount of stool (poop) in the intestine. This guide tells you what medicine to use.  What do I need to know before starting the clean out?  It will take about 4 to 6 hours to take the medicine.  After taking the medicine, you should have a large stool within 24 hours.  Plan to stay close to a bathroom until the stool has passed. After the intestine is cleaned out, you will need to take a daily medicine.   Remember:  Constipation can last a long time. It may take 6 to 12 months for you to get back to regular bowel movements (BMs). Be patient. Things will get better slowly over time.  If you have questions, call your doctor at this number:     [phone]  What medicine do I need to take?  You need to take Miralax, a powder that you mix in a clear liquid.  Follow these steps: ?    Stir the Miralax powder into water, juice, or Gatorade. Your Miralax dose is: 8 capfuls of Miralax powder in 32  ounces of liquid. Take 1 tablet of senna daily.  ?    Drink 4 to 8 ounces every 30 minutes. It will take 4 to 6 hours to finish the medicine. ?    After the medicine is gone, drink more water or juice. This will help with the cleanout.   -     If the medicine gives you an upset stomach, slow down or stop.   Do I need to keep taking medicine?                                                                                                      After the clean out, you will take a daily (maintenance) medicine for at least 6 months. Your Miralax dose is:    1  capful of powder in 8 ounces of liquid every day   You should go to the doctor for follow-up appointments as directed.  What if I get constipated again?  Some people need to have the clean out more than one time for the problem to go  away. Contact your doctor to ask if you should repeat the clean out. It is OK to do it again, but you should wait at least a week before repeating the clean out.    Will I have any problems with the medicine?   You may have stomach pain or cramping during the clean out. This might mean you have to go to the bathroom.   Take some time to sit on the toilet. The pain will go away when the stool is gone. You may want to read while you wait. A warm bath may also help.   What should I eat and drink?  Drink lots of water and juice. Fruits and vegetables are good foods to eat. Try to avoid greasy and fatty foods. Goal is to get 25-35 grams of fiber daily.

## 2021-07-15 ENCOUNTER — Ambulatory Visit (INDEPENDENT_AMBULATORY_CARE_PROVIDER_SITE_OTHER): Payer: Medicaid Other

## 2021-07-15 ENCOUNTER — Other Ambulatory Visit: Payer: Self-pay

## 2021-07-15 DIAGNOSIS — Z30019 Encounter for initial prescription of contraceptives, unspecified: Secondary | ICD-10-CM | POA: Diagnosis present

## 2021-07-15 MED ORDER — MEDROXYPROGESTERONE ACETATE 150 MG/ML IM SUSP
150.0000 mg | Freq: Once | INTRAMUSCULAR | Status: AC
  Administered 2021-07-15: 150 mg via INTRAMUSCULAR

## 2021-07-15 NOTE — Progress Notes (Signed)
Patient here today for Depo Provera injection and is within her dates.    Last contraceptive appt was 04/28/2021.  Depo given in LUOQ today. Site unremarkable & patient tolerated injection.    Next injection due 09/30/2021-10/14/2021.  Reminder card given.

## 2021-07-16 DIAGNOSIS — K5900 Constipation, unspecified: Secondary | ICD-10-CM | POA: Insufficient documentation

## 2021-07-16 NOTE — Assessment & Plan Note (Signed)
Provided and reviewed constipation clean out and maintenance plan with patient. Discussed eating small meals frequently and increased fluid intake, as well as gradual increase in fiber intake with dried fruits, vegetables with skins, beans, whole grains and cereal. Goal ~30g of fiber per day. Encouraged drinking hot beverages and prune juice; add probiotic-containing foods like pasteurized yogurt and kefir; encourage increased physical activity.

## 2021-07-16 NOTE — Assessment & Plan Note (Addendum)
Living at the [facility]ity house 

## 2021-07-16 NOTE — Assessment & Plan Note (Addendum)
Stable. Taking Zoloft 50 mg however pt woukld like to decrease to 25 mg daily. Follow up in 3 weeks as scheduled.

## 2021-07-21 ENCOUNTER — Other Ambulatory Visit: Payer: Self-pay

## 2021-07-21 NOTE — Telephone Encounter (Signed)
Patient calls nurse line requesting Sertraline 25mg  sent in. Patient reports she can not cut her 50mg s in half.   Please advise.

## 2021-07-22 MED ORDER — SERTRALINE HCL 25 MG PO TABS: 25.0000 mg | ORAL_TABLET | Freq: Every day | ORAL | 0 refills | Status: DC

## 2021-07-28 ENCOUNTER — Telehealth: Payer: Self-pay

## 2021-07-28 NOTE — Telephone Encounter (Signed)
Patient calls nurse line requesting a letter stating that she can take OTC cold medication for cough, headache and body aches.   Patient reports that she took a negative home COVID test, however, 21 month old child tested positive.   Advised patient that she could retest in a few days and to quarantine per CDC guidelines.   Patient states that she needs this letter to be able to receive OTC medications due to living in [facility]. Patient would like letter to be sent through [facility] if possible.   Please advise.   Veronda Prude, RN

## 2021-07-29 ENCOUNTER — Other Ambulatory Visit: Payer: Self-pay | Admitting: Family Medicine

## 2021-07-29 DIAGNOSIS — F32A Depression, unspecified: Secondary | ICD-10-CM

## 2021-07-29 NOTE — Telephone Encounter (Signed)
Sent letter from Dr. Rachael Darby to patient's mychart.   Veronda Prude, RN

## 2021-08-05 ENCOUNTER — Ambulatory Visit: Payer: Medicaid Other | Admitting: Family Medicine
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
## 2021-10-09 ENCOUNTER — Ambulatory Visit (INDEPENDENT_AMBULATORY_CARE_PROVIDER_SITE_OTHER): Payer: Medicaid Other

## 2021-10-09 DIAGNOSIS — Z30019 Encounter for initial prescription of contraceptives, unspecified: Secondary | ICD-10-CM | POA: Diagnosis present

## 2021-10-09 MED ORDER — MEDROXYPROGESTERONE ACETATE 150 MG/ML IM SUSP
150.0000 mg | Freq: Once | INTRAMUSCULAR | Status: AC
  Administered 2021-10-09: 150 mg via INTRAMUSCULAR

## 2021-10-09 NOTE — Progress Notes (Signed)
Patient here today for Depo Provera injection and is within her dates.   ?  ?Last contraceptive appt was 04/28/2021. ?  ?Depo given in RUOQ today. Site unremarkable & patient tolerated injection.   ?  ?Next injection due 12/25/2021-01/08/2022. Reminder card given.   ?  ?

## 2021-11-17 ENCOUNTER — Encounter: Payer: Self-pay | Admitting: *Deleted

## 2021-12-30 ENCOUNTER — Ambulatory Visit: Payer: Medicaid Other

## 2021-12-31 ENCOUNTER — Ambulatory Visit: Payer: Medicaid Other

## 2022-01-07 ENCOUNTER — Ambulatory Visit (INDEPENDENT_AMBULATORY_CARE_PROVIDER_SITE_OTHER): Payer: Medicaid Other | Admitting: *Deleted

## 2022-01-07 DIAGNOSIS — Z30013 Encounter for initial prescription of injectable contraceptive: Secondary | ICD-10-CM

## 2022-01-07 DIAGNOSIS — Z30019 Encounter for initial prescription of contraceptives, unspecified: Secondary | ICD-10-CM

## 2022-01-07 MED ORDER — MEDROXYPROGESTERONE ACETATE 150 MG/ML IM SUSY: 150.0000 mg | PREFILLED_SYRINGE | Freq: Once | INTRAMUSCULAR | Status: DC

## 2022-01-07 NOTE — Progress Notes (Signed)
Patient here today for Depo Provera injection and is within her dates.    Last contraceptive appt was 04/2021  Depo given in LUOQ today.  Site unremarkable & patient tolerated injection.    Next injection due 03/25/22 - 04/08/22.  Reminder card given.    Jone Baseman, CMA

## 2022-01-26 ENCOUNTER — Ambulatory Visit: Payer: Medicaid Other

## 2022-02-01 ENCOUNTER — Ambulatory Visit: Payer: Medicaid Other

## 2022-02-16 ENCOUNTER — Ambulatory Visit (INDEPENDENT_AMBULATORY_CARE_PROVIDER_SITE_OTHER): Payer: Medicaid Other

## 2022-02-16 DIAGNOSIS — Z111 Encounter for screening for respiratory tuberculosis: Secondary | ICD-10-CM

## 2022-02-16 NOTE — Progress Notes (Signed)
Patient is here for a PPD placement.  PPD placed in right forearm (tattoo on left forearm) @ 9:45 am.  Patient will return 02/18/2022 to have PPD read. Veronda Prude, RN

## 2022-02-18 ENCOUNTER — Ambulatory Visit: Payer: Medicaid Other

## 2022-02-18 DIAGNOSIS — Z111 Encounter for screening for respiratory tuberculosis: Secondary | ICD-10-CM

## 2022-02-18 LAB — TB SKIN TEST
Induration: 0 mm
TB Skin Test: NEGATIVE

## 2022-02-18 NOTE — Progress Notes (Signed)
PPD Reading Note PPD read and results entered in EpicCare. Result: 0 mm induration. Interpretation: Negative Allergic reaction: No  

## 2022-04-26 ENCOUNTER — Ambulatory Visit (HOSPITAL_COMMUNITY)
Admission: EM | Admit: 2022-04-26 | Discharge: 2022-04-26 | Disposition: A | Discharge status: Home / Self Care | Payer: Medicaid Other | Attending: Physician Assistant | Admitting: Physician Assistant

## 2022-04-26 ENCOUNTER — Encounter (HOSPITAL_COMMUNITY): Payer: Self-pay

## 2022-04-26 DIAGNOSIS — Z79899 Other long term (current) drug therapy: Secondary | ICD-10-CM | POA: Diagnosis not present

## 2022-04-26 DIAGNOSIS — J069 Acute upper respiratory infection, unspecified: Secondary | ICD-10-CM | POA: Insufficient documentation

## 2022-04-26 DIAGNOSIS — Z1152 Encounter for screening for COVID-19: Secondary | ICD-10-CM | POA: Insufficient documentation

## 2022-04-26 LAB — RESP PANEL BY RT-PCR (FLU A&B, COVID) ARPGX2
Influenza A by PCR: NEGATIVE
Influenza B by PCR: NEGATIVE
SARS Coronavirus 2 by RT PCR: NEGATIVE

## 2022-04-26 MED ORDER — FLUTICASONE PROPIONATE 50 MCG/ACT NA SUSP: 1.0000 | Freq: Every day | NASAL | 0 refills | Status: DC

## 2022-04-26 MED ORDER — CETIRIZINE HCL 10 MG PO TABS: 10.0000 mg | ORAL_TABLET | Freq: Every day | ORAL | 0 refills | Status: DC

## 2022-04-26 NOTE — ED Triage Notes (Signed)
Pt needs a covid test to return back to work

## 2022-04-26 NOTE — ED Provider Notes (Signed)
[facility]    CSN: [id number] Arrival date & time: 04/26/22  [id number]      History   Chief Complaint Chief Complaint  Patient presents with   covid test    HPI Kristin Hunt is a 21 y.o. female.   Patient presents today with a 4-day history of URI symptoms.  Reports nasal congestion, rhinorrhea, cough.  Denies any fever, chest pain, shortness of breath, nausea, vomiting, weakness.  Reports household sick contacts with similar symptoms.  She has not had a COVID vaccine.  She denies history of having COVID.  Denies any significant past medical history including allergies, asthma, COPD, smoking.  She has not been taking any over-the-counter medication for symptom management.  Denies any recent antibiotics or steroids.  She is requesting COVID testing to satisfy employer requirement.    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Patient Active Problem List   Diagnosis Date Noted   Constipation 07/16/2021   Anxiety and depression 04/30/2021   Susceptible to Varicella (non-immune), currently pregnant in third trimester 01/28/2021   History of pregnancy loss in prior pregnancy, currently pregnant 01/22/2021   History of shoulder dystocia in prior pregnancy 01/22/2021   Limited prenatal care in second trimester 11/26/2020   Supervision of normal intrauterine pregnancy in multigravida 11/26/2020   Homeless 11/26/2020   Pseudoseizure     Past Surgical History:  Procedure Laterality Date   WISDOM TOOTH EXTRACTION     2018    OB History     Gravida  3   Para  2   Term  2   Preterm  0   AB  1   Living  2      SAB  1   IAB  0   Ectopic  0   Multiple  0   Live Births  2            Home Medications    Prior to Admission medications   Medication Sig Start Date End Date Taking? Authorizing Provider  cetirizine (ZYRTEC ALLERGY) 10 MG tablet Take 1 tablet (10 mg total) by mouth at bedtime. 04/26/22  Yes Sneijder Bernards  K, PA-C  fluticasone (FLONASE) 50 MCG/ACT nasal spray Place 1 spray into both nostrils daily. 04/26/22  Yes Alvester Eads, Noberto Retort, PA-C  acetaminophen (TYLENOL) 325 MG tablet Take 2 tablets (650 mg total) by mouth every 4 (four) hours as needed (for pain scale < 4). 03/31/21   Cresenzo-Dishmon, Scarlette Calico, CNM  ibuprofen (ADVIL) 600 MG tablet Take 1 tablet (600 mg total) by mouth every 6 (six) hours. 04/28/21   Katha Cabal, DO    Family History Family History  Problem Relation Age of Onset   Diabetes Paternal Grandmother    Lung cancer Paternal Grandmother     Social History Social History   Tobacco Use   Smoking status: Light Smoker    Types: Cigarettes   Smokeless tobacco: Never   Tobacco comments:    smoked two weeks ago  Vaping Use   Vaping Use: Never used  Substance Use Topics   Alcohol use: No   Drug use: No     Allergies   Patient has no known allergies.   Review of Systems Review of Systems  Constitutional:  Positive for activity change and fatigue. Negative for appetite change and fever.  HENT:  Positive for congestion and rhinorrhea. Negative for sinus pressure, sneezing and sore throat.   Respiratory:  Positive for cough. Negative for shortness of breath.   Cardiovascular:  Negative for chest pain.  Gastrointestinal:  Negative for abdominal pain, diarrhea, nausea and vomiting.  Neurological:  Negative for dizziness, light-headedness and headaches.     Physical Exam Triage Vital Signs ED Triage Vitals [04/26/22 0931]  Enc Vitals Group     BP 119/75     Pulse Rate 66     Resp 12     Temp 98.3 F (36.8 C)     Temp Source Oral     SpO2 97 %     Weight      Height      Head Circumference      Peak Flow      Pain Score      Pain Loc      Pain Edu?      Excl. in GC?    No data found.  Updated Vital Signs BP 119/75 (BP Location: Left Arm)   Pulse 66   Temp 98.3 F (36.8 C) (Oral)   Resp 12   LMP  (LMP Unknown)   SpO2 97%   Visual Acuity Right  Eye Distance:   Left Eye Distance:   Bilateral Distance:    Right Eye Near:   Left Eye Near:    Bilateral Near:     Physical Exam Vitals reviewed.  Constitutional:      General: She is awake. She is not in acute distress.    Appearance: Normal appearance. She is well-developed. She is not ill-appearing.     Comments: Very pleasant female appears stated age in no acute distress sitting comfortably in exam room  HENT:     Head: Normocephalic and atraumatic.     Right Ear: Tympanic membrane, ear canal and external ear normal. Tympanic membrane is not erythematous or bulging.     Left Ear: Tympanic membrane, ear canal and external ear normal. Tympanic membrane is not erythematous or bulging.     Nose:     Right Sinus: No maxillary sinus tenderness or frontal sinus tenderness.     Left Sinus: No maxillary sinus tenderness or frontal sinus tenderness.     Mouth/Throat:     Pharynx: Uvula midline. Posterior oropharyngeal erythema present. No oropharyngeal exudate.  Cardiovascular:     Rate and Rhythm: Normal rate and regular rhythm.     Heart sounds: Normal heart sounds, S1 normal and S2 normal. No murmur heard. Pulmonary:     Effort: Pulmonary effort is normal.     Breath sounds: Normal breath sounds. No wheezing, rhonchi or rales.     Comments: Clear to auscultation bilaterally Psychiatric:        Behavior: Behavior is cooperative.      UC Treatments / Results  Labs (all labs ordered are listed, but only abnormal results are displayed) Labs Reviewed  RESP PANEL BY RT-PCR (FLU A&B, COVID) [id number]    EKG   Radiology No results found.  Procedures Procedures (including critical care time)  Medications Ordered in UC Medications - No data to display  Initial Impression / Assessment and Plan / UC Course  I have reviewed the triage vital signs and the nursing notes.  Pertinent labs & imaging results that were available during my care of the patient were reviewed by me and  considered in my medical decision making (see chart for details).     Patient is well-appearing, afebrile, nontoxic, nontachycardic.  COVID/flu testing was obtained today-results pending.  We will contact her if  this is positive.  She is not a candidate for antiviral she is young and healthy with no significant risk factors.  She is outside the window of effectiveness for Tamiflu.  We will manage symptoms with over-the-counter medications.  Flonase and Zyrtec was sent to pharmacy.  She is to rest and drink plenty of fluid.  She was provided work excuse note with current CDC return to work guidelines based on COVID test result.  She is to monitor her MyChart for these results.  Discussed that if she feels any worsening symptoms or if anything changes she is to return for reevaluation.  Final Clinical Impressions(s) / UC Diagnoses   Final diagnoses:  Upper respiratory tract infection, unspecified type     Discharge Instructions      Monitor your MyChart for your results.  We will contact you if you are positive.  Follow the instructions on your work note regarding when you can return based on this result.  Use cetirizine and Flonase to help with your congestion.  You can use over-the-counter medication including Tylenol and Mucinex.  Make sure you are resting and drinking plenty of fluid.  If you have any worsening or changing symptoms you need to be seen immediately.     ED Prescriptions     Medication Sig Dispense Auth. Provider   fluticasone (FLONASE) 50 MCG/ACT nasal spray Place 1 spray into both nostrils daily. 16 g Aleicia Kenagy K, PA-C   cetirizine (ZYRTEC ALLERGY) 10 MG tablet Take 1 tablet (10 mg total) by mouth at bedtime. 30 tablet Joseeduardo Brix, Noberto Retort, PA-C      PDMP not reviewed this encounter.   Jeani Hawking, PA-C 04/26/22 1006

## 2022-04-26 NOTE — Discharge Instructions (Signed)
Monitor your MyChart for your results.  We will contact you if you are positive.  Follow the instructions on your work note regarding when you can return based on this result.  Use cetirizine and Flonase to help with your congestion.  You can use over-the-counter medication including Tylenol and Mucinex.  Make sure you are resting and drinking plenty of fluid.  If you have any worsening or changing symptoms you need to be seen immediately.

## 2022-05-31 ENCOUNTER — Other Ambulatory Visit (HOSPITAL_COMMUNITY)
Admission: RE | Admit: 2022-05-31 | Discharge: 2022-05-31 | Disposition: A | Discharge status: Home / Self Care | Payer: Medicaid Other | Source: Ambulatory Visit | Attending: Family Medicine | Admitting: Family Medicine

## 2022-05-31 ENCOUNTER — Encounter: Payer: Self-pay | Admitting: Family Medicine

## 2022-05-31 ENCOUNTER — Ambulatory Visit (INDEPENDENT_AMBULATORY_CARE_PROVIDER_SITE_OTHER): Payer: Medicaid Other | Admitting: Family Medicine

## 2022-05-31 VITALS — BP 122/73 | HR 88 | Wt 188.0 lb

## 2022-05-31 DIAGNOSIS — Z0000 Encounter for general adult medical examination without abnormal findings: Secondary | ICD-10-CM

## 2022-05-31 DIAGNOSIS — Z309 Encounter for contraceptive management, unspecified: Secondary | ICD-10-CM | POA: Diagnosis not present

## 2022-05-31 DIAGNOSIS — Z113 Encounter for screening for infections with a predominantly sexual mode of transmission: Secondary | ICD-10-CM | POA: Insufficient documentation

## 2022-05-31 LAB — POCT URINE PREGNANCY: Preg Test, Ur: NEGATIVE

## 2022-05-31 MED ORDER — MEDROXYPROGESTERONE ACETATE 150 MG/ML IM SUSY
150.0000 mg | PREFILLED_SYRINGE | Freq: Once | INTRAMUSCULAR | Status: AC
  Administered 2022-05-31: 150 mg via INTRAMUSCULAR

## 2022-05-31 NOTE — Progress Notes (Unsigned)
    SUBJECTIVE:   Chief compliant/HPI: annual examination  Kristin Hunt is a 21 y.o. who presents today for an annual exam.   Tobacco: 1 black and mild a day Alcohol: none Rec drug use: none  Requests STI testing. Asymptomatic   Does not exercise- walks occasionally for work   Not on medications   Declines flu vaccine    OBJECTIVE:   BP 122/73   Pulse 88   Wt 188 lb (85.3 kg)   SpO2 100%   BMI 33.30 kg/m    Physical exam General: well appearing, NAD Cardiovascular: RRR, no murmurs Lungs: CTAB. Normal WOB Abdomen: soft, non-distended, non-tender Skin: warm, dry. No edema Pelvic exam: normal external genitalia, vulva, vagina, cervix. Increased white discharge present   ASSESSMENT/PLAN:   No problem-specific Assessment & Plan notes found for this encounter.    Annual Examination  See AVS for age appropriate recommendations.   PHQ score 8, reviewed and discussed. Blood pressure reviewed and at goal .  The patient is not on depo for contraception.   Considered the following items based upon USPSTF recommendations: HIV testing: ordered Syphilis if at high risk: ordered GC/CT at high risk, ordered.  Lipid panel (nonfasting or fasting) discussed based upon AHA recommendations and not ordered.  Consider repeat every 4-6 years.  Reviewed risk factors for latent tuberculosis and not indicated Cervical cancer screening:  up to date  Follow up in 1 year or sooner if indicated.    Cora Collum, DO [facility]

## 2022-05-31 NOTE — Patient Instructions (Signed)
It was great seeing you today!  Today we did your physical, and checked for STIs.  I will call you or MyChart you if anything is abnormal  Have also completed the paperwork for your job.  We will see you back in 1 year for your next physical, but if you need to be seen earlier than that for any new issues we're happy to fit you in, just give [place] a call!  Feel free to call with any questions or concerns at any time, at [phone].   Take care,  Dr. Cora Collum [facility]

## 2022-06-01 LAB — CERVICOVAGINAL ANCILLARY ONLY
Bacterial Vaginitis (gardnerella): POSITIVE — AB
Candida Glabrata: NEGATIVE
Candida Vaginitis: NEGATIVE
Chlamydia: POSITIVE — AB
Comment: NEGATIVE
Comment: NEGATIVE
Comment: NEGATIVE
Comment: NEGATIVE
Comment: NEGATIVE
Comment: NORMAL
Neisseria Gonorrhea: NEGATIVE
Trichomonas: NEGATIVE

## 2022-06-01 LAB — RPR: RPR Ser Ql: NONREACTIVE

## 2022-06-01 LAB — HIV ANTIBODY (ROUTINE TESTING W REFLEX): HIV Screen 4th Generation wRfx: NONREACTIVE

## 2022-06-02 ENCOUNTER — Other Ambulatory Visit: Payer: Self-pay | Admitting: Family Medicine

## 2022-06-02 MED ORDER — DOXYCYCLINE HYCLATE 100 MG PO TABS: 100.0000 mg | ORAL_TABLET | Freq: Two times a day (BID) | ORAL | 0 refills | Status: AC

## 2022-07-12 ENCOUNTER — Telehealth: Payer: Self-pay

## 2022-07-12 NOTE — Telephone Encounter (Signed)
Patient calls nurse line in regards to Doxycycline prescription.   This prescription was sent over on 06/02/2022. Patient reports she never picked this up and is requesting we send the prescription over now.   I called the pharmacy and confirmed medication was never picked up. They report they will get the prescription ready for her now.

## 2022-12-13 NOTE — Progress Notes (Deleted)
  SUBJECTIVE:   CHIEF COMPLAINT / HPI:   Here for checkup and to discuss Depo ***  History of chlamydia and BV treated in January '24  Most recent Pap smear 2022, normal, next due in 2025 ***  PERTINENT  PMH / PSH: ***  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OBJECTIVE:  There were no vitals taken for this visit.  General: NAD, pleasant, able to participate in exam Cardiac: RRR, no murmurs auscultated Respiratory: CTAB, normal WOB Abdomen: soft, non-tender, non-distended, normoactive bowel sounds Extremities: warm and well perfused, no edema or cyanosis Skin: warm and dry, no rashes noted Neuro: alert, no obvious focal deficits, speech normal Psych: Normal affect and mood  ASSESSMENT/PLAN:   There are no diagnoses linked to this encounter. No orders of the defined types were placed in this encounter.  No follow-ups on file.  Vonna Drafts, MD [facility]

## 2022-12-14 ENCOUNTER — Ambulatory Visit: Payer: Medicaid Other | Admitting: Family Medicine

## 2022-12-21 ENCOUNTER — Encounter: Payer: Self-pay | Admitting: Family Medicine

## 2022-12-21 ENCOUNTER — Ambulatory Visit (INDEPENDENT_AMBULATORY_CARE_PROVIDER_SITE_OTHER): Payer: Medicaid Other | Admitting: Family Medicine

## 2022-12-21 VITALS — BP 102/62 | HR 86 | Ht 63.0 in | Wt 163.0 lb

## 2022-12-21 DIAGNOSIS — Z309 Encounter for contraceptive management, unspecified: Secondary | ICD-10-CM | POA: Diagnosis not present

## 2022-12-21 DIAGNOSIS — F419 Anxiety disorder, unspecified: Secondary | ICD-10-CM

## 2022-12-21 DIAGNOSIS — F32A Depression, unspecified: Secondary | ICD-10-CM

## 2022-12-21 LAB — POCT URINE PREGNANCY: Preg Test, Ur: NEGATIVE

## 2022-12-21 MED ORDER — SERTRALINE HCL 25 MG PO TABS: 25.0000 mg | ORAL_TABLET | Freq: Every day | ORAL | 0 refills | Status: DC

## 2022-12-21 MED ORDER — HYDROXYZINE HCL 10 MG PO TABS: 10.0000 mg | ORAL_TABLET | Freq: Three times a day (TID) | ORAL | 0 refills | Status: DC | PRN

## 2022-12-21 MED ORDER — MEDROXYPROGESTERONE ACETATE 150 MG/ML IM SUSY
150.0000 mg | PREFILLED_SYRINGE | Freq: Once | INTRAMUSCULAR | Status: AC
Start: 2022-12-21 — End: 2022-12-21
  Administered 2022-12-21: 150 mg via INTRAMUSCULAR

## 2022-12-21 NOTE — Progress Notes (Signed)
SUBJECTIVE:   CHIEF COMPLAINT / HPI:   Here for checkup and to discuss Depo as well as anxiety/depression  History of chlamydia and BV treated in January '24  Most recent Pap smear 2022, normal, next due in 2025   Reports last being sexually active about 4 days ago does not always use protection.  LMP about 3 to 4 weeks ago.  She was on Depo previously and this worked well for her.   Anxiety and depression: She reports multiple stressors including childhood trauma, the loss of her mother, pain levels at home, work, taking care of children by herself.  All of these things have added up over the past several months and has led to worsening anxiety/depression.  She used to be on Zoloft which kept her very well-controlled and she is interested in restarting this.  She is also been having anxiety/panic attacks once or twice a week where she feels her heart racing and she has to dissociate herself from socialization.  She has never tried Atarax.  Denies current or previous SI/HI, denies previous suicidal attempts.   PERTINENT  PMH / PSH:   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OBJECTIVE:  BP 102/62   Pulse 86   Ht 5\' 3"  (1.6 m)   Wt 163 lb (73.9 kg)   LMP 12/07/2022   SpO2 98%   BMI 28.87 kg/m   General: NAD, pleasant, able to participate in exam Respiratory: No respiratory distress Skin: warm and dry, no rashes noted Psych: tearful, depressed/anxious mood  ASSESSMENT/PLAN:   Anxiety and depression Assessment & Plan: In the setting of multiple stressors, ongoing for several months.  She has previously established with a therapist but has not seen them in a while-she is willing to reach out to reestablish with them.  Her PHQ-9 is 23 although her score to question #9 is 0.  Denies current or previous thoughts of self-harm.  Will restart Zoloft given that she was previously well-controlled on this.  Instructed to take 25 mg for the first week  then increase to 50 mg daily.  Follow-up in 4 to 6 weeks.  Also prescribed Atarax as needed for anxiety/panic attacks  Orders: -     Sertraline HCl; Take 1 tablet (25 mg total) by mouth daily. Take 1 tablet daily for 1 week. Then, start taking 2 tablets (50mg ) daily.  Dispense: 90 tablet; Refill: 0 -     hydrOXYzine HCl; Take 1 tablet (10 mg total) by mouth 3 (three) times daily as needed. For anxiety/panic attacks.  Dispense: 30 tablet; Refill: 0  Encounter for contraceptive management, unspecified type Assessment & Plan: Starting Depo today.  Pregnancy test negative in clinic although she has been sexually active within the last week and LMP was greater than 1 week ago.  Okay to receive Depo today but instructed patient to return in 2 weeks for a repeat pregnancy test to confirm.  Orders: -     POCT urine pregnancy -     POCT urine pregnancy; Future -     medroxyPROGESTERone Acetate   Meds ordered this encounter  Medications   sertraline (ZOLOFT) 25 MG tablet    Sig: Take 1 tablet (25 mg total) by mouth daily. Take 1 tablet daily for 1 week. Then, start taking 2 tablets (50mg ) daily.    Dispense:  90 tablet    Refill:  0   hydrOXYzine (ATARAX) 10 MG tablet  Sig: Take 1 tablet (10 mg total) by mouth 3 (three) times daily as needed. For anxiety/panic attacks.    Dispense:  30 tablet    Refill:  0   medroxyPROGESTERone Acetate SUSY 150 mg   Return in about 5 weeks (around 01/25/2023) for Anxiety/depression.  Vonna Drafts, MD [facility]

## 2022-12-21 NOTE — Assessment & Plan Note (Signed)
In the setting of multiple stressors, ongoing for several months.  She has previously established with a therapist but has not seen them in a while-she is willing to reach out to reestablish with them.  Her PHQ-9 is 23 although her score to question #9 is 0.  Denies current or previous thoughts of self-harm.  Will restart Zoloft given that she was previously well-controlled on this.  Instructed to take 25 mg for the first week then increase to 50 mg daily.  Follow-up in 4 to 6 weeks.  Also prescribed Atarax as needed for anxiety/panic attacks

## 2022-12-21 NOTE — Progress Notes (Signed)
Patient here today for Depo Provera injection and is within her dates.    Last contraceptive appt was 12/21/2022.  Depo given in LUOQ today.  Site unremarkable & patient tolerated injection.    Next injection due 03/07/2023-04/04/2023.  Reminder card given.

## 2022-12-21 NOTE — Patient Instructions (Addendum)
Please take Zoloft 25mg  (1 tablet) daily for the next week, then take 2 tablets (50mg ) daily. Then, follow up with [place] in about 5-6 weeks.   You may have some worsening of your symptoms in the first 4-6 weeks of taking this medication, until it fully kicks in.  You can use as-needed hydroxyzine (Atarax) if you feel any anxiety/panic attacks. You can use this up to 3 times daily.  Please return on Tuesday, July 23rd at 3pm to do a quick repeat pregnancy test. You received a depo shot today and will be due again in 3 months.

## 2022-12-21 NOTE — Assessment & Plan Note (Signed)
Starting Depo today.  Pregnancy test negative in clinic although she has been sexually active within the last week and LMP was greater than 1 week ago.  Okay to receive Depo today but instructed patient to return in 2 weeks for a repeat pregnancy test to confirm.

## 2023-01-04 ENCOUNTER — Ambulatory Visit: Payer: Self-pay

## 2023-01-28 ENCOUNTER — Ambulatory Visit: Payer: Self-pay | Admitting: Family Medicine

## 2023-01-28 NOTE — Progress Notes (Deleted)
  SUBJECTIVE:   CHIEF COMPLAINT / HPI:   Follow-up for anxiety and depression At last visit 7/9, patient was restarted on Zoloft (instructed to take 25 mg for the first week then increase to 50 mg daily) Was also prescribed Atarax as needed for anxiety/panic attacks PHQ-9 score of 23 at last visit ***  PERTINENT  PMH / PSH: ***  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OBJECTIVE:  There were no vitals taken for this visit.  General: NAD, pleasant, able to participate in exam Cardiac: RRR, no murmurs auscultated Respiratory: CTAB, normal WOB Abdomen: soft, non-tender, non-distended, normoactive bowel sounds Extremities: warm and well perfused, no edema or cyanosis Skin: warm and dry, no rashes noted Neuro: alert, no obvious focal deficits, speech normal Psych: Normal affect and mood  ASSESSMENT/PLAN:   There are no diagnoses linked to this encounter. No orders of the defined types were placed in this encounter.  No follow-ups on file.  Vonna Drafts, MD [facility]

## 2023-05-18 ENCOUNTER — Encounter: Payer: Medicaid Other | Admitting: Student

## 2023-05-24 ENCOUNTER — Other Ambulatory Visit (HOSPITAL_COMMUNITY)
Admission: RE | Admit: 2023-05-24 | Discharge: 2023-05-24 | Disposition: A | Discharge status: Home / Self Care | Payer: Medicaid Other | Source: Ambulatory Visit | Attending: Family Medicine | Admitting: Family Medicine

## 2023-05-24 ENCOUNTER — Ambulatory Visit: Payer: Medicaid Other | Admitting: Student

## 2023-05-24 ENCOUNTER — Ambulatory Visit: Payer: Medicaid Other | Admitting: Family Medicine

## 2023-05-24 VITALS — BP 120/75 | HR 69 | Ht 64.0 in | Wt 159.2 lb

## 2023-05-24 DIAGNOSIS — N898 Other specified noninflammatory disorders of vagina: Secondary | ICD-10-CM | POA: Diagnosis present

## 2023-05-24 DIAGNOSIS — Z309 Encounter for contraceptive management, unspecified: Secondary | ICD-10-CM

## 2023-05-24 DIAGNOSIS — Z113 Encounter for screening for infections with a predominantly sexual mode of transmission: Secondary | ICD-10-CM

## 2023-05-24 DIAGNOSIS — F419 Anxiety disorder, unspecified: Secondary | ICD-10-CM | POA: Diagnosis not present

## 2023-05-24 DIAGNOSIS — F32A Depression, unspecified: Secondary | ICD-10-CM | POA: Diagnosis not present

## 2023-05-24 LAB — POCT WET PREP (WET MOUNT)
Clue Cells Wet Prep Whiff POC: NEGATIVE
Trichomonas Wet Prep HPF POC: ABSENT

## 2023-05-24 LAB — POCT URINE PREGNANCY: Preg Test, Ur: NEGATIVE

## 2023-05-24 MED ORDER — SERTRALINE HCL 25 MG PO TABS: 25.0000 mg | ORAL_TABLET | Freq: Every day | ORAL | 0 refills | Status: DC

## 2023-05-24 MED ORDER — MEDROXYPROGESTERONE ACETATE 150 MG/ML IM SUSP
150.0000 mg | Freq: Once | INTRAMUSCULAR | Status: AC
Start: 2023-05-24 — End: 2023-05-24
  Administered 2023-05-24: 150 mg via INTRAMUSCULAR

## 2023-05-24 NOTE — Patient Instructions (Addendum)
In 2 weeks, (06/07/23) please send me a photo of a pregnancy test via MyChart. Or, you can let me know if you prefer to come drop off urine at our office instead.  Your next Depo shot is due in 3 months  Please see me in 4-6 weeks to adjust your zoloft dose if needed  I will let you know if your testing from today comes back positive

## 2023-05-24 NOTE — Progress Notes (Cosign Needed Addendum)
    SUBJECTIVE:   CHIEF COMPLAINT / HPI:   Contraception - started on Depo July 2024 -Last shot in July, did not take another dose in the meantime -LMP 12/1, has been sexually active since then  Anxiety/depression - started on Zoloft and prn Atarax July 2024 at her last visit with me -Pt reports she did not end up starting her Zoloft, would like it resent to pharmacy as she is interested in starting this again. Worked well for her in the past  STD testing  -has noticed some vaginal discharge the past few days, no abd pain or fevers   PERTINENT  PMH / PSH: anxiety depression  OBJECTIVE:   BP 120/75   Pulse 69   Ht 5\' 4"  (1.626 m)   Wt 159 lb 3.2 oz (72.2 kg)   LMP 05/15/2023   SpO2 99%   BMI 27.33 kg/m   General: NAD, pleasant, able to participate in exam Respiratory: No respiratory distress Skin: warm and dry, no rashes noted Psych: Normal affect and mood  Pelvic exam: VULVA: normal appearing vulva with no masses, tenderness or lesions, VAGINA: vaginal discharge - copious and milky, CERVIX: normal appearing cervix without discharge or lesions, exam chaperoned by Jon Gills CMA.  ASSESSMENT/PLAN:   Assessment & Plan Vaginal discharge No fevers or abdominal pain, declines HIV/RPR testing -wet mount negative, g/c collected today Encounter for contraceptive management, unspecified type -urine preg negative today -Depo today (last shot in July) - advised to use alternate contraceptive method for the first 1-2 weeks -advised to send mychart message with repeat pregnancy test in 2 weeks -Next shot in 3 months Anxiety and depression -refilled zoloft per my note in July 2024 - start with 25mg  daily then increase to 50mg  daily in 1 week. F/u in 4-6 weeks for titration. PHQ9 8 with 0 on question 9.   Vonna Drafts, MD [facility]

## 2023-05-24 NOTE — Assessment & Plan Note (Signed)
-  urine preg negative today -Depo today (last shot in July) - advised to use alternate contraceptive method for the first 1-2 weeks -advised to send mychart message with repeat pregnancy test in 2 weeks -Next shot in 3 months
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
## 2023-05-24 NOTE — Assessment & Plan Note (Addendum)
-  refilled zoloft per my note in July 2024 - start with 25mg  daily then increase to 50mg  daily in 1 week. F/u in 4-6 weeks for titration. PHQ9 8 with 0 on question 9.

## 2023-05-26 ENCOUNTER — Telehealth: Payer: Self-pay | Admitting: Family Medicine

## 2023-05-26 DIAGNOSIS — A749 Chlamydial infection, unspecified: Secondary | ICD-10-CM

## 2023-05-26 LAB — CERVICOVAGINAL ANCILLARY ONLY
Chlamydia: POSITIVE — AB
Comment: NEGATIVE
Comment: NEGATIVE
Comment: NORMAL
Neisseria Gonorrhea: NEGATIVE
Trichomonas: NEGATIVE

## 2023-05-26 MED ORDER — DOXYCYCLINE HYCLATE 100 MG PO TABS
100.0000 mg | ORAL_TABLET | Freq: Two times a day (BID) | ORAL | 0 refills | Status: AC
Start: 2023-05-26 — End: 2023-06-02

## 2023-05-26 NOTE — Telephone Encounter (Signed)
Called to discuss positive chlamydia. Doxy 100mg  BID x7d, sent to pharmacy. Negative UPT at recent visit. Encouraged her to have her partner reach out to their physician for treatment. Pt is interested in HIV and RPR testing but would like to call back later to schedule a lab visit for this, I have placed future orders. May perform test of cure in 3 months  Vonna Drafts, MD

## 2023-06-17 ENCOUNTER — Emergency Department (HOSPITAL_COMMUNITY): Payer: Medicaid Other

## 2023-06-17 ENCOUNTER — Emergency Department (HOSPITAL_COMMUNITY)
Admission: EM | Admit: 2023-06-17 | Discharge: 2023-06-17 | Disposition: A | Discharge status: Home / Self Care | Payer: Medicaid Other | Attending: Emergency Medicine | Admitting: Emergency Medicine

## 2023-06-17 ENCOUNTER — Other Ambulatory Visit: Payer: Self-pay

## 2023-06-17 ENCOUNTER — Encounter (HOSPITAL_COMMUNITY): Payer: Self-pay

## 2023-06-17 DIAGNOSIS — R110 Nausea: Secondary | ICD-10-CM | POA: Diagnosis not present

## 2023-06-17 DIAGNOSIS — R0781 Pleurodynia: Secondary | ICD-10-CM | POA: Diagnosis present

## 2023-06-17 DIAGNOSIS — Z5900 Homelessness unspecified: Secondary | ICD-10-CM | POA: Diagnosis not present

## 2023-06-17 DIAGNOSIS — R1011 Right upper quadrant pain: Secondary | ICD-10-CM | POA: Insufficient documentation

## 2023-06-17 LAB — CBC WITH DIFFERENTIAL/PLATELET
Abs Immature Granulocytes: 0.02 10*3/uL (ref 0.00–0.07)
Basophils Absolute: 0 10*3/uL (ref 0.0–0.1)
Basophils Relative: 0 %
Eosinophils Absolute: 0.1 10*3/uL (ref 0.0–0.5)
Eosinophils Relative: 2 %
HCT: 41.5 % (ref 36.0–46.0)
Hemoglobin: 13.7 g/dL (ref 12.0–15.0)
Immature Granulocytes: 0 %
Lymphocytes Relative: 22 %
Lymphs Abs: 1.5 10*3/uL (ref 0.7–4.0)
MCH: 29.7 pg (ref 26.0–34.0)
MCHC: 33 g/dL (ref 30.0–36.0)
MCV: 90 fL (ref 80.0–100.0)
Monocytes Absolute: 0.5 10*3/uL (ref 0.1–1.0)
Monocytes Relative: 7 %
Neutro Abs: 4.7 10*3/uL (ref 1.7–7.7)
Neutrophils Relative %: 69 %
Platelets: 306 10*3/uL (ref 150–400)
RBC: 4.61 MIL/uL (ref 3.87–5.11)
RDW: 11.9 % (ref 11.5–15.5)
WBC: 6.8 10*3/uL (ref 4.0–10.5)
nRBC: 0 % (ref 0.0–0.2)

## 2023-06-17 LAB — COMPREHENSIVE METABOLIC PANEL
ALT: 15 U/L (ref 0–44)
AST: 15 U/L (ref 15–41)
Albumin: 3.5 g/dL (ref 3.5–5.0)
Alkaline Phosphatase: 61 U/L (ref 38–126)
Anion gap: 10 (ref 5–15)
BUN: 10 mg/dL (ref 6–20)
CO2: 22 mmol/L (ref 22–32)
Calcium: 9.4 mg/dL (ref 8.9–10.3)
Chloride: 103 mmol/L (ref 98–111)
Creatinine, Ser: 0.73 mg/dL (ref 0.44–1.00)
GFR, Estimated: >60 mL/min (ref >60)
Glucose, Bld: 96 mg/dL (ref 70–99)
Potassium: 3.7 mmol/L (ref 3.5–5.1)
Sodium: 135 mmol/L (ref 135–145)
Total Bilirubin: 0.6 mg/dL (ref 0.0–1.2)
Total Protein: 7.8 g/dL (ref 6.5–8.1)

## 2023-06-17 LAB — URINALYSIS, ROUTINE W REFLEX MICROSCOPIC
Bilirubin Urine: NEGATIVE
Glucose, UA: NEGATIVE mg/dL
Hgb urine dipstick: NEGATIVE
Ketones, ur: NEGATIVE mg/dL
Nitrite: NEGATIVE
Protein, ur: NEGATIVE mg/dL
Specific Gravity, Urine: 1.028 (ref 1.005–1.030)
pH: 6 (ref 5.0–8.0)

## 2023-06-17 LAB — HCG, SERUM, QUALITATIVE: Preg, Serum: NEGATIVE

## 2023-06-17 LAB — LIPASE, BLOOD: Lipase: 26 U/L (ref 11–51)

## 2023-06-17 MED ORDER — CYCLOBENZAPRINE HCL 10 MG PO TABS: 10.0000 mg | ORAL_TABLET | Freq: Two times a day (BID) | ORAL | 0 refills | Status: DC | PRN

## 2023-06-17 MED ORDER — CYCLOBENZAPRINE HCL 10 MG PO TABS
10.0000 mg | ORAL_TABLET | Freq: Once | ORAL | Status: AC
  Administered 2023-06-17: 10 mg via ORAL
  Filled 2023-06-17: qty 1

## 2023-06-17 MED ORDER — LIDOCAINE 5 % EX PTCH
1.0000 | MEDICATED_PATCH | CUTANEOUS | Status: DC
  Administered 2023-06-17: 1 via TRANSDERMAL
  Filled 2023-06-17: qty 1

## 2023-06-17 MED ORDER — KETOROLAC TROMETHAMINE 15 MG/ML IJ SOLN
15.0000 mg | Freq: Once | INTRAMUSCULAR | Status: AC
  Administered 2023-06-17: 15 mg via INTRAMUSCULAR
  Filled 2023-06-17: qty 1

## 2023-06-17 MED ORDER — LIDOCAINE 5 % EX PTCH: 1.0000 | MEDICATED_PATCH | CUTANEOUS | 0 refills | Status: AC

## 2023-06-17 NOTE — ED Notes (Signed)
 RN attempted to administer pain medication but pt is not in room. Pt may have been transported to [place]. RN will circle back shortly.

## 2023-06-17 NOTE — ED Provider Triage Note (Signed)
 Emergency Medicine Provider Triage Evaluation Note  Kristin Hunt , a 23 y.o. female  was evaluated in triage.  Pt complains of seven days of right lower rib pain, radiating to right side of abdomen.  Review of Systems  Positive: Right rib, shortness of breath when taking deep breath in, nausea  Negative: Dysuria, hematuria,   Physical Exam  BP 120/81   Pulse 72   Temp (!) 97.4 F (36.3 C)   Resp 17   LMP 05/15/2023   SpO2 100%  Gen:   Awake, no distress   Resp:  Normal effort  MSK:   Moves extremities without difficulty, point tenderness over lateral right ribs, no deformity, no rash  Other:  Negative for CVA tenderness  Medical Decision Making  Medically screening exam initiated at 8:42 AM.  Appropriate orders placed.  Kristin Hunt was informed that the remainder of the evaluation will be completed by another provider, this initial triage assessment does not replace that evaluation, and the importance of remaining in the ED until their evaluation is complete.  Denies trauma or fall. Reports right sided flank pain/ lower rib pain, some associated nausea.  Shortness of breath with taking a deep breath in, pain is worse with movement.    Shermon Warren SAILOR, [place] 06/17/23 [id number]

## 2023-06-17 NOTE — ED Notes (Signed)
 Evlyn Kanner PA inquired if pt has a ride home before administering a muscle relaxant.   Pt verbalized she has a ride.

## 2023-06-17 NOTE — ED Notes (Signed)
 Pt provided water with PO medication.

## 2023-06-17 NOTE — ED Notes (Signed)
 Pt says she only have pain on her right lateral side

## 2023-06-17 NOTE — ED Notes (Signed)
 Pt inquired about muscle relaxant and CT scan. RN notified the EDP

## 2023-06-17 NOTE — ED Triage Notes (Signed)
 RUQ pain and rib pain.  Reports x 7 days with n/v.  Reports it feels like something is squeezing her.  Reports it hurts to move cough or sneezie

## 2023-06-17 NOTE — Discharge Instructions (Signed)
 Please follow-up with your primary care provider in regards recent ER visit.  Today your labs and imaging were all reassuring you most likely have rib pain from muscle strains.  You may take Tylenol  or ibuprofen  every 6 hours needed for pain and use ice and rest over the next few days have attached a work note for you.  If symptoms change or worsen please return to the ER.

## 2023-06-17 NOTE — ED Provider Notes (Signed)
 [place] EMERGENCY DEPARTMENT AT [facility] Provider Note   CSN: [id number] Arrival date & time: 06/17/23  0801     History  Chief Complaint  Patient presents with   Abdominal Pain    Kristin Hunt is a 23 y.o. 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  Patient states she works [employer] but denies any heavy lifting.  Patient dates that hurts to move and that if she coughs this hurts her right ribs.  Patient states the pain greatest in her right ribs to her right upper quadrant that she tells her gallbladder.  Patient states that the pain makes her nauseous but denies any vomiting, constipation, diarrhea, dysuria, hematuria.  Patient denies fevers.  Patient denies chest pain or shortness of breath.  Patient tried ibuprofen  to no relief.  Home Medications Prior to Admission medications   Medication Sig Start Date End Date Taking? Authorizing Provider  cyclobenzaprine  (FLEXERIL ) 10 MG tablet Take 1 tablet (10 mg total) by mouth 2 (two) times daily as needed for muscle spasms. 06/17/23  Yes Bao Coreas, Lynwood DASEN, PA-C  ibuprofen  (ADVIL ) 800 MG tablet Take 1,600 mg by mouth daily as needed for fever, headache or moderate pain (pain score 4-6).   Yes [provider]  lidocaine  (LIDODERM ) 5 % Place 1 patch onto the skin daily. Remove & Discard patch within 12 hours or as directed by MD 06/17/23  Yes Roslynn Holte, Lynwood DASEN, PA-C  sertraline  (ZOLOFT ) 25 MG tablet Take 1 tablet (25 mg total) by mouth daily. Take 1 tablet daily for 1 week. Then, start taking 2 tablets (50mg ) daily. Patient not taking: Reported on 06/17/2023 05/24/23   Romelle Booty, MD      Allergies    Patient has no known allergies.    Review of Systems   Review of Systems  Gastrointestinal:  Positive for abdominal pain.    Physical Exam Updated Vital Signs BP (!) 117/53 (BP Location: Left Arm)   Pulse 63   Temp 98.3 F (36.8 C) (Oral)    Resp 16   Ht 5' 4 (1.626 m)   Wt 72.1 kg   LMP 05/15/2023   SpO2 99%   BMI 27.29 kg/m  Physical Exam Vitals reviewed.  Constitutional:      General: She is not in acute distress. HENT:     Head: Normocephalic and atraumatic.  Eyes:     Extraocular Movements: Extraocular movements intact.     Conjunctiva/sclera: Conjunctivae normal.     Pupils: Pupils are equal, round, and reactive to light.  Cardiovascular:     Rate and Rhythm: Normal rate and regular rhythm.     Pulses: Normal pulses.     Heart sounds: Normal heart sounds.     Comments: 2+ bilateral radial/dorsalis pedis pulses with regular rate Pulmonary:     Effort: Pulmonary effort is normal. No respiratory distress.     Breath sounds: Normal breath sounds.  Abdominal:     Palpations: Abdomen is soft.     Tenderness: There is abdominal tenderness. There is no guarding or rebound.  Musculoskeletal:        General: Normal range of motion.     Cervical back: Normal range of motion and neck supple.     Comments: 5 out of 5 bilateral grip/leg extension strength  Skin:    General: Skin is warm and dry.     Capillary Refill: Capillary refill takes less than 2 seconds.  Findings: No rash.  Neurological:     General: No focal deficit present.     Mental Status: She is alert and oriented to person, place, and time.     Comments: Sensation intact in all 4 limbs  Psychiatric:        Mood and Affect: Mood normal.     ED Results / Procedures / Treatments   Labs (all labs ordered are listed, but only abnormal results are displayed) Labs Reviewed  URINALYSIS, ROUTINE W REFLEX MICROSCOPIC - Abnormal; Notable for the following components:      Result Value   APPearance HAZY (*)    Leukocytes,Ua MODERATE (*)    Bacteria, UA FEW (*)    All other components within normal limits  COMPREHENSIVE METABOLIC PANEL  LIPASE, BLOOD  CBC WITH DIFFERENTIAL/PLATELET  HCG, SERUM, QUALITATIVE    EKG None  Radiology US  Abdomen  Limited RUQ (LIVER/GB) Result Date: 06/17/2023 CLINICAL DATA:  151471 RUQ pain 151471 EXAM: ULTRASOUND ABDOMEN LIMITED RIGHT UPPER QUADRANT COMPARISON:  MRI abdomen 02/21/2020.  US  Abdomen, 02/21/2020. FINDINGS: Gallbladder: No gallstones or wall thickening visualized. No sonographic Murphy sign noted by sonographer. Common bile duct: Diameter: 0.4 cm Liver: No focal lesion identified. Within normal limits in parenchymal echogenicity. Portal vein is patent on color Doppler imaging with normal direction of blood flow towards the liver. Other: No perihepatic fluid IMPRESSION: No acute sonographic findings within the RIGHT upper quadrant. Electronically Signed   By: Thom Hall M.D.   On: 06/17/2023 17:19   DG Ribs Unilateral W/Chest Right Result Date: 06/17/2023 CLINICAL DATA:  Lower right rib pain. EXAM: RIGHT RIBS AND CHEST - 3+ VIEW COMPARISON:  01/16/2020. FINDINGS: Bilateral lung fields are clear. Bilateral costophrenic angles are clear. Normal cardio-mediastinal silhouette. No acute osseous abnormalities. No acute rib fracture.  No focal rib lesion. The soft tissues are within normal limits. IMPRESSION: *No acute cardiopulmonary abnormality. *No acute rib fracture or focal rib lesion. Electronically Signed   By: Ree Molt M.D.   On: 06/17/2023 10:13    Procedures Procedures    Medications Ordered in ED Medications  lidocaine  (LIDODERM ) 5 % 1 patch (1 patch Transdermal Patch Applied 06/17/23 1741)  ketorolac  (TORADOL ) 15 MG/ML injection 15 mg (15 mg Intramuscular Given 06/17/23 1612)  cyclobenzaprine  (FLEXERIL ) tablet 10 mg (10 mg Oral Given 06/17/23 1647)    ED Course/ Medical Decision Making/ A&P                                 Medical Decision Making  Kristin Hunt 23 y.o. presented today for right rib pain. Working DDx that I considered at this time includes, but not limited to, muscle strain, fracture, cholecystitis, hepatobiliary pathology, gastritis, pancreatitis, herpes zoster.  R/o  DDx:  fracture, cholecystitis, hepatobiliary pathology, gastritis, pancreatitis, herpes zoster: These are considered less likely due to history of present illness, physical exam, labs/imaging findings  Review of prior external notes: 04/26/2022 ED  Unique Tests and My Interpretation:  CBC: Unremarkable CMP: Unremarkable Lipase: Unremarkable hCG serum qualitative: Negative UA: Unremarkable Right rib x-ray: Unremarkable Right upper quadrant ultrasound: Unremarkable  Social Determinants of Health: homeless  Discussion with Independent Historian: None  Discussion of Management of Tests: None  Risk: Medium: prescription drug management  Risk Stratification Score: None  Plan: On exam patient was in no acute distress stable vitals.  Patient did have tenderness to right lower ribs with point tenderness to the bones  without bony deformities or crepitus noted.  Patient also had right upper quadrant tenderness as well without peritoneal signs.  Patient's labs in triage are reassuring and after a conversation patient agreed on arrival quadrant ultrasound.  Patient be given Toradol  along with a muscle relaxer she does have a ride home.  Patient states after being given the Toradol  her pain got better and so do suspect this is MSK in nature.  Right upper quadrant ultrasound was negative for any acute pathology and so we will give lidocaine  patch and prescribe this and have her follow-up with her primary care provider.  Will attach work note as well.  At this time with reassuring exam and symptoms getting better with medications here do not feel patient needs CT scan at this time.  Patient I had a discussion about his CT scan and agreed to forego CT at this time.  I will also prescribe the patient Flexeril .  I spoke to the patient about Flexeril  as a muscle relaxant will make her drowsy and to only use before bedtime do not operate machinery or drive and patient verbalized understanding acceptance of  this.  Patient was given return precautions. Patient stable for discharge at this time.  Patient verbalized understanding of plan.  This chart was dictated using voice recognition software.  Despite best efforts to proofread,  errors can occur which can change the documentation meaning.         Final Clinical Impression(s) / ED Diagnoses Final diagnoses:  Rib pain on right side    Rx / DC Orders ED Discharge Orders          Ordered    lidocaine  (LIDODERM ) 5 %  Every 24 hours        06/17/23 1738    cyclobenzaprine  (FLEXERIL ) 10 MG tablet  2 times daily PRN        06/17/23 1745              Victor Lynwood DASEN, PA-C 06/17/23 1817    Yolande Lamar BROCKS, MD 06/19/23 [id number]

## 2023-06-17 NOTE — ED Notes (Signed)
 Pt ambulated to restroom without incident.

## 2023-08-22 ENCOUNTER — Other Ambulatory Visit: Payer: Self-pay | Admitting: Family Medicine

## 2023-08-22 DIAGNOSIS — F32A Depression, unspecified: Secondary | ICD-10-CM

## 2024-03-02 ENCOUNTER — Ambulatory Visit (INDEPENDENT_AMBULATORY_CARE_PROVIDER_SITE_OTHER): Admitting: Family Medicine

## 2024-03-02 ENCOUNTER — Other Ambulatory Visit (HOSPITAL_COMMUNITY)
Admission: RE | Admit: 2024-03-02 | Discharge: 2024-03-02 | Disposition: A | Discharge status: Home / Self Care | Source: Ambulatory Visit | Attending: Family Medicine | Admitting: Family Medicine

## 2024-03-02 ENCOUNTER — Encounter: Payer: Self-pay | Admitting: Family Medicine

## 2024-03-02 VITALS — BP 109/68 | HR 90 | Ht 64.0 in | Wt 179.6 lb

## 2024-03-02 DIAGNOSIS — D649 Anemia, unspecified: Secondary | ICD-10-CM

## 2024-03-02 DIAGNOSIS — Z124 Encounter for screening for malignant neoplasm of cervix: Secondary | ICD-10-CM

## 2024-03-02 DIAGNOSIS — N898 Other specified noninflammatory disorders of vagina: Secondary | ICD-10-CM

## 2024-03-02 DIAGNOSIS — Z113 Encounter for screening for infections with a predominantly sexual mode of transmission: Secondary | ICD-10-CM

## 2024-03-02 DIAGNOSIS — Z789 Other specified health status: Secondary | ICD-10-CM | POA: Diagnosis not present

## 2024-03-02 LAB — POCT URINE PREGNANCY: Preg Test, Ur: NEGATIVE

## 2024-03-02 NOTE — Progress Notes (Cosign Needed Addendum)
    SUBJECTIVE:   CHIEF COMPLAINT / HPI:    Kristin Hunt is a 23 year old F that presents for STI check. - Reports 2 wk hx of vaginal itching, white discharge, and urinary frequency - Denies dysuria, fevers - Is sexually active. Does not use condoms consistently - Missed 2 shots of depo  - She is also interested in donating plasma but needs clearance. Reports no hx of seizure, stroke, bleeding disorder. She has hx of pseudo-seizure, but workup was negative a that time.    Contraception? - Declines at this time. She feels like the depo is causing weight changes and she wants to get off hormonal contraception  PERTINENT  PMH / PSH: no pertinent medical history  OBJECTIVE:   BP 109/68   Pulse 90   Ht 5' 4 (1.626 m)   Wt 179 lb 9.6 oz (81.5 kg)   LMP  (LMP Unknown)   SpO2 99%   Breastfeeding No   BMI 30.83 kg/m   General: Alert, pleasant woman. NAD. HEENT: NCAT. MMM. CV: RRR, no murmurs.  Resp: CTAB, no wheezing or crackles. Normal WOB on RA.  Abm: Soft, nontender, nondistended. BS present. Ext: Moves all ext spontaneously Skin: Warm, well perfused  GU: Thick, white cottage-cheese discharge noted in vaginal vault. No lesions or ulcerations.   Kristin Hunt present as chaperone  ASSESSMENT/PLAN:   Assessment & Plan Vaginal itching Hx and exam is consistent with yeast infection.  Will also check for other STIs.  Counseled patient on safe sex practices. Counseled on contraception options including nonhormonal IUD, condoms. Advised to use daily prenatal if she is not consistently using contraception.  - Fluconazole  150mg  once for yeast - f/u HIV, RPR, GC, Ch, Trich - Upreg neg Anemia, unspecified type Pt has hx of anemia, will monitor. VSS. - CBC, ferritin - Letter provided to allow pt to donate plasma Cervical cancer screening Pap smear today    Twyla Nearing, MD [facility]

## 2024-03-02 NOTE — Patient Instructions (Addendum)
 1) I will reach out with any abnormal results.   2) We can give you a shot of depot today. We recommend getting another pregnancy test in 2 weeks. You can either do a home pregnancy test or come back for a pregnancy test here.   3) You are safe to donate plasma. I will provide a letter for you.

## 2024-03-03 LAB — CBC
Hematocrit: 41.1 % (ref 34.0–46.6)
Hemoglobin: 13.2 g/dL (ref 11.1–15.9)
MCH: 29.9 pg (ref 26.6–33.0)
MCHC: 32.1 g/dL (ref 31.5–35.7)
MCV: 93 fL (ref 79–97)
Platelets: 251 x10E3/uL (ref 150–450)
RBC: 4.42 x10E6/uL (ref 3.77–5.28)
RDW: 11.8 % (ref 11.7–15.4)
WBC: 5.2 x10E3/uL (ref 3.4–10.8)

## 2024-03-03 LAB — RPR: RPR Ser Ql: NONREACTIVE

## 2024-03-03 LAB — HIV ANTIBODY (ROUTINE TESTING W REFLEX): HIV Screen 4th Generation wRfx: NONREACTIVE

## 2024-03-03 LAB — FERRITIN: Ferritin: 24 ng/mL (ref 15–150)

## 2024-03-04 MED ORDER — FLUCONAZOLE 150 MG PO TABS: 150.0000 mg | ORAL_TABLET | Freq: Once | ORAL | 0 refills | Status: AC

## 2024-03-05 ENCOUNTER — Ambulatory Visit (HOSPITAL_BASED_OUTPATIENT_CLINIC_OR_DEPARTMENT_OTHER): Payer: Self-pay | Admitting: Family Medicine

## 2024-03-05 ENCOUNTER — Ambulatory Visit

## 2024-03-05 DIAGNOSIS — R87612 Low grade squamous intraepithelial lesion on cytologic smear of cervix (LGSIL): Secondary | ICD-10-CM

## 2024-03-05 DIAGNOSIS — A749 Chlamydial infection, unspecified: Secondary | ICD-10-CM

## 2024-03-05 LAB — CERVICOVAGINAL ANCILLARY ONLY
Chlamydia: NEGATIVE
Comment: NEGATIVE
Comment: NORMAL
Neisseria Gonorrhea: NEGATIVE

## 2024-03-05 NOTE — Addendum Note (Signed)
 Addended by: ELICIA HAMLET on: 03/05/2024 11:42 AM   Modules accepted: Orders

## 2024-03-07 LAB — CYTOLOGY - PAP
Adequacy: ABSENT
Chlamydia: POSITIVE — AB
Comment: NEGATIVE
Comment: NEGATIVE
Comment: NORMAL
Neisseria Gonorrhea: NEGATIVE
Trichomonas: NEGATIVE

## 2024-03-09 DIAGNOSIS — R87612 Low grade squamous intraepithelial lesion on cytologic smear of cervix (LGSIL): Secondary | ICD-10-CM | POA: Insufficient documentation

## 2024-03-09 MED ORDER — DOXYCYCLINE HYCLATE 100 MG PO TABS: 100.0000 mg | ORAL_TABLET | Freq: Two times a day (BID) | ORAL | 0 refills | Status: AC

## 2024-03-13 ENCOUNTER — Ambulatory Visit

## 2024-03-13 DIAGNOSIS — Z111 Encounter for screening for respiratory tuberculosis: Secondary | ICD-10-CM | POA: Diagnosis present

## 2024-03-14 NOTE — Progress Notes (Signed)
 Patient is here for a PPD placement.  PPD placed in right forearm @ 3:20 pm.  Patient will return 03/15/24 to have PPD read. Chiquita JAYSON English, RN

## 2024-03-15 ENCOUNTER — Ambulatory Visit

## 2024-03-19 NOTE — Progress Notes (Signed)
 Reviewed and agree.

## 2024-05-04 ENCOUNTER — Ambulatory Visit (INDEPENDENT_AMBULATORY_CARE_PROVIDER_SITE_OTHER)

## 2024-05-04 DIAGNOSIS — Z111 Encounter for screening for respiratory tuberculosis: Secondary | ICD-10-CM | POA: Diagnosis present

## 2024-05-04 NOTE — Progress Notes (Signed)
 Patient presents to nurse clinic for PPD placement.  PPD placed in left forearm without complication.  Patient to return on 11/24 to have site read.

## 2024-05-07 ENCOUNTER — Ambulatory Visit

## 2024-05-19 ENCOUNTER — Emergency Department (HOSPITAL_BASED_OUTPATIENT_CLINIC_OR_DEPARTMENT_OTHER)
Admission: EM | Admit: 2024-05-19 | Discharge: 2024-05-19 | Disposition: A | Discharge status: Home / Self Care | Attending: Emergency Medicine | Admitting: Emergency Medicine

## 2024-05-19 ENCOUNTER — Emergency Department (HOSPITAL_BASED_OUTPATIENT_CLINIC_OR_DEPARTMENT_OTHER)

## 2024-05-19 ENCOUNTER — Other Ambulatory Visit: Payer: Self-pay

## 2024-05-19 ENCOUNTER — Encounter (HOSPITAL_BASED_OUTPATIENT_CLINIC_OR_DEPARTMENT_OTHER): Payer: Self-pay

## 2024-05-19 LAB — PREGNANCY, URINE: Preg Test, Ur: NEGATIVE

## 2024-05-19 MED ORDER — KETOROLAC TROMETHAMINE 30 MG/ML IJ SOLN
30.0000 mg | Freq: Once | INTRAMUSCULAR | Status: AC
  Administered 2024-05-19: 30 mg via INTRAMUSCULAR
  Filled 2024-05-19: qty 1

## 2024-05-19 MED ORDER — MELOXICAM 7.5 MG PO TABS: 7.5000 mg | ORAL_TABLET | Freq: Every day | ORAL | 0 refills | Status: DC | PRN

## 2024-05-19 MED ORDER — CYCLOBENZAPRINE HCL 10 MG PO TABS: 10.0000 mg | ORAL_TABLET | Freq: Two times a day (BID) | ORAL | 0 refills | Status: AC | PRN
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
## 2024-05-19 NOTE — ED Notes (Signed)
 Pt was asked to provide a urine sample. Pt is refusing stating, I refuse to do anything until I get pain medications.

## 2024-05-19 NOTE — Discharge Instructions (Signed)
 It was a pleasure meeting with you today.  As we discussed your imaging showed no acute fractures or dislocations.  You will be sore for the next few days.  I have sent a short course of muscle relaxers to pharmacy and you can use over-the-counter medications for pain relief as described below.  If pain persists or worsens, or any other concerning symptoms develop please return for further evaluation.  Follow-up with primary care as needed.  Please use Tylenol  or ibuprofen  for pain.  You may use 600 mg ibuprofen  every 6 hours or 1000 mg of Tylenol  every 6 hours.  You may choose to alternate between the 2.  This would be most effective.  Not to exceed 4 g of Tylenol  within 24 hours.  Not to exceed 3200 mg ibuprofen  24 hours.
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
## 2024-05-21 ENCOUNTER — Telehealth (HOSPITAL_BASED_OUTPATIENT_CLINIC_OR_DEPARTMENT_OTHER): Payer: Self-pay
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
## 2024-05-22 ENCOUNTER — Encounter: Payer: Self-pay | Admitting: Student

## 2024-05-22 ENCOUNTER — Other Ambulatory Visit: Payer: Self-pay

## 2024-05-22 ENCOUNTER — Encounter (HOSPITAL_COMMUNITY): Payer: Self-pay | Admitting: *Deleted

## 2024-05-22 ENCOUNTER — Ambulatory Visit: Admitting: Student

## 2024-05-22 ENCOUNTER — Emergency Department (HOSPITAL_COMMUNITY)
Admission: EM | Admit: 2024-05-22 | Discharge: 2024-05-22 | Discharge status: Against Medical Advice | Attending: Emergency Medicine | Admitting: Emergency Medicine

## 2024-05-22 VITALS — BP 117/84 | HR 75 | Ht 64.0 in | Wt 174.6 lb

## 2024-05-22 DIAGNOSIS — M542 Cervicalgia: Secondary | ICD-10-CM
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
## 2024-05-22 NOTE — ED Notes (Signed)
 Pt left stating she was going [facility] for the issues she is here for today.

## 2024-05-22 NOTE — Patient Instructions (Addendum)
 It was great to see you! Thank you for allowing me to participate in your care!   I recommend that you always bring your medications to each appointment as this makes it easy to ensure we are on the correct medications and helps us  not miss when refills are needed.  Our plans for today:  - Please go to the emergency department.  I recommend you have a CT scan of your neck.  I also recommend to be evaluated for necessity of IV pain medication given your severe pain.  Take care and seek immediate care sooner if you develop any concerns. Please remember to show up 15 minutes before your scheduled appointment time!  Gladis Church, DO [facility]

## 2024-05-23 ENCOUNTER — Other Ambulatory Visit: Payer: Self-pay

## 2024-05-23 ENCOUNTER — Encounter (HOSPITAL_BASED_OUTPATIENT_CLINIC_OR_DEPARTMENT_OTHER): Payer: Self-pay | Admitting: Emergency Medicine

## 2024-05-23 ENCOUNTER — Other Ambulatory Visit (HOSPITAL_BASED_OUTPATIENT_CLINIC_OR_DEPARTMENT_OTHER): Payer: Self-pay

## 2024-05-23 ENCOUNTER — Emergency Department (HOSPITAL_BASED_OUTPATIENT_CLINIC_OR_DEPARTMENT_OTHER)
Admission: EM | Admit: 2024-05-23 | Discharge: 2024-05-23 | Disposition: A | Discharge status: Home / Self Care | Source: Ambulatory Visit | Attending: Emergency Medicine | Admitting: Emergency Medicine

## 2024-05-23 ENCOUNTER — Emergency Department (HOSPITAL_BASED_OUTPATIENT_CLINIC_OR_DEPARTMENT_OTHER)

## 2024-05-23 DIAGNOSIS — S161XXA Strain of muscle, fascia and tendon at neck level, initial encounter: Secondary | ICD-10-CM | POA: Diagnosis not present

## 2024-05-23 DIAGNOSIS — M542 Cervicalgia: Secondary | ICD-10-CM | POA: Diagnosis present

## 2024-05-23 DIAGNOSIS — R519 Headache, unspecified: Secondary | ICD-10-CM | POA: Diagnosis not present

## 2024-05-23 DIAGNOSIS — Y92410 Unspecified street and highway as the place of occurrence of the external cause: Secondary | ICD-10-CM | POA: Diagnosis not present

## 2024-05-23 MED ORDER — OXYCODONE HCL 5 MG PO TABS: 5.0000 mg | ORAL_TABLET | ORAL | 0 refills | Status: DC | PRN

## 2024-05-23 MED ORDER — OXYCODONE HCL 5 MG PO TABS
5.0000 mg | ORAL_TABLET | ORAL | Status: AC
  Administered 2024-05-23: 5 mg via ORAL
  Filled 2024-05-23: qty 1

## 2024-05-23 MED ORDER — OXYCODONE HCL 5 MG PO TABS
5.0000 mg | ORAL_TABLET | ORAL | 0 refills | Status: DC | PRN
  Filled 2024-05-23: qty 10, 2d supply, fill #0

## 2024-05-23 MED ORDER — OXYCODONE HCL 5 MG PO TABS
5.0000 mg | ORAL_TABLET | ORAL | 0 refills | Status: AC | PRN
  Filled 2024-05-23 (×2): qty 10, 2d supply, fill #0

## 2024-05-23 MED ORDER — LIDOCAINE 5 % EX PTCH
1.0000 | MEDICATED_PATCH | CUTANEOUS | Status: DC
  Administered 2024-05-23: 1 via TRANSDERMAL
  Filled 2024-05-23: qty 1
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
## 2024-05-23 NOTE — ED Notes (Addendum)
 Pt alert and oriented X 4 at the time of discharge. RR even and unlabored. No acute distress noted. Pt verbalized understanding of discharge instructions as discussed. Pt transported in wheelchair to lobby at time of discharge.

## 2024-05-25 ENCOUNTER — Ambulatory Visit: Admitting: Student

## 2024-05-25 VITALS — BP 117/75 | HR 83 | Ht 64.0 in | Wt 174.2 lb

## 2024-05-25 DIAGNOSIS — F419 Anxiety disorder, unspecified: Secondary | ICD-10-CM

## 2024-05-25 DIAGNOSIS — M542 Cervicalgia: Secondary | ICD-10-CM

## 2024-05-25 MED ORDER — SERTRALINE HCL 50 MG PO TABS: ORAL_TABLET | ORAL | 0 refills | Status: DC

## 2024-05-25 NOTE — Patient Instructions (Addendum)
 It was great to see you! Thank you for allowing me to participate in your care!   I recommend that you always bring your medications to each appointment as this makes it easy to ensure we are on the correct medications and helps us  not miss when refills are needed.  Our plans for today:  - Take 50 mg (1 tablet) of sertraline  every day for 7 days, then start taking 100 mg (2 tablet) daily - Follow-up in 4 weeks - Continue your flexeril , meloxicam . You can take tylenol , use lidocaine  patches, heat pads and ice for extra pain cotnrol - You will receive all call to start physical therapy   Therapy and Counseling Resources Most providers on this list will take Medicaid. Patients with commercial insurance or Medicare should contact their insurance company to get a list of in network providers.  [facility] (takes children) Location 1: [address], [address] [place], [place] [address] Location 2: [address] [place], [place] [address] [phone]   [facility] (spanish speaking therapist available)(habla espanol)(take medicare and medicaid)  [address], [place], [place] [address], [place]  [email or web address] [phone]  [facility]:Psychiatry and Counseling [address] [place], [place] [address] [phone]  [facility]   [address], Suite [place], [place] [address]      [phone]  [facility] (spanish available) [address]  [place], [place] [address] [phone]  [facility] (take [facility] and medicare) [address], [address]  [place], [place] [address]       [phone]     [facility] (virtual only) [phone]  [facility] [address], [place], [place] [phone]  [facility]:  [address] [place] [phone]  [facility]:  [address] [place] [phone]  [facility] (under &  uninsured) [address]   [place] [phone]    [email or web address]    [facility] [phone] [address]  [place]    [phone]  [facility] -[address]     Phone:  [phone]     [facility]-  [address]  [phone]   [facility] [address]      [place], [place] [phone]  [facility]: [address] [place], [place], [place]  [phone]   [facility] (Spanish therapist) [email or web address]  [address] [place]  [address]   [place] [address]    [phone]    [facility]   [address],  [place], [place]  [phone]   [facility]  [address] [place]  [phone]  [facility]  [address] [place], [place]        [phone]  Open Access/Walk In Clinic under & uninsured  [facility]  [address] [place], [place] [phone] Crisis [phone]  [facility] [address],  (Spanish)   [address] , [place]: [phone]) 8:30 - 12; 1 - 2:30  [facility] [employer],  [address] [place], [place]    [phone]):8:30 - 12; 2 - 3PM  [facility] [employer],  [address]  [place]; [phone]):   Mon - Fri 8 AM - 5 PM  [facility] [address] [place]  [place]  12:30 to 3:00 or call to schedule an appointment  [phone]  Specific Provider options Psychology Today  https://www.psychologytoday.com/us  click on find a therapist  enter your zip code left side and select or tailor a therapist for your specific need.   [facility] Provider Directory [email or web address]  (Medicaid)   Follow all drop down to find a provider  Social Support program [facility] [place] [phone] or photosolver.pl [address], [place], [place] Recovery support and educational   24- Hour Availability:   [facility]  [address] [place], [place] [phone] Crisis [phone]  [facility] [phone]  [facility]  [phone]   [facility]  [phone] (after hours)  Therapeutic Alternative/Mobile Crisis   [phone]  [place]  National Suicide Hotline  404-235-8412 [place])  Call 911 or go to emergency room  [facility]  [phone]);  [place] and [place]  [phone]); [place], [place], [place], [place], [place], [place], [place]   Take care and seek immediate care sooner if you develop any concerns. Please remember to show up 15 minutes before your scheduled appointment time!    Psychiatry Resource List (Adults and Children) Most of these providers will take Medicaid. please consult your insurance for a complete and updated list of available providers. When calling to make an appointment have your insurance information available to confirm you are covered.   [facility]iatry and Counseling [address] [facility] [address] [place], [place] [address] [phone]  [facility]  [address] [place], [place] [phone] Crisis [phone]   [facility]:   [facility]: [address]     [phone]   [place]: [address] [place],        [phone] [place]: [address] [phone],    [phone] [place]: [phone] [address],                   [phone] Children: [facility] [address]         [phone]  [facility] (virtual only) [phone]   [facility]  (Psychiatry only; Adults /children 12 and over, will take Medicaid)  [address] [place] [address], [place], [place] [address]       [phone]   [facility] (Psychiatry & counseling ; adults & children ; will take Medicaid [address]  [place] [address]  Go on-line to complete referral ( https://www.[email or web address].org/en/make-[email or web address]-referral 9415139733    (Spanish speaking therapists)  [facility]  Psychiatry & counseling; Adults and children;  Call Registration prior to scheduling an appointment [phone] [address]    [place], [place] [address]    [phone]  [facility] (Psychiatry & counseling; adults & children; Medicare no Medicaid)  [address]   [place], [place]  [address]      [phone]    [facility] (up to age 44)  Psychiatry & counseling ,will take Medicaid, must do counseling to receive psychiatry services  [address] [place] [address]        [phone]  [facility] (Psychiatry & counseling; adults & children; will take Medicaid) Will need a referral from provider [address],  [place], [place]  [phone]   [facility] --- Walk-In Mon-Friday 8am-3pm ( will  take Medicaid, Psychiatry, Adults & children,  [address] [place], [place]   [phone]   [facility], Walk-in M-F 8am-12pm and 1pm -3pm   (Counseling, Psychiatry, will take Medicaid, adults & children)  [address], [place], [place]  [phone]        Gladis Church, DO [facility]

## 2024-05-25 NOTE — Assessment & Plan Note (Signed)
-  Trial sertraline  50 mg daily for 7 days, titrate to 100 mg daily for 7 days - Counseled on side effects, and reasons to stop - Resources for counseling/psychiatry for Medicaid provided on AVS - Follow-up in 4 weeks
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
## 2024-05-28 ENCOUNTER — Other Ambulatory Visit: Payer: Self-pay

## 2024-05-28 ENCOUNTER — Other Ambulatory Visit: Payer: Self-pay | Admitting: Student

## 2024-05-28 ENCOUNTER — Other Ambulatory Visit (HOSPITAL_COMMUNITY): Payer: Self-pay

## 2024-05-28 DIAGNOSIS — F419 Anxiety disorder, unspecified: Secondary | ICD-10-CM

## 2024-05-28 MED ORDER — SERTRALINE HCL 50 MG PO TABS
ORAL_TABLET | ORAL | 0 refills | Status: DC
  Filled 2024-05-28: qty 53, 30d supply, fill #0

## 2024-05-28 MED ORDER — SERTRALINE HCL 50 MG PO TABS: ORAL_TABLET | ORAL | 0 refills | Status: AC

## 2024-05-28 NOTE — Progress Notes (Signed)
 Refill Zoloft  to correct pharmacy

## 2024-06-28 ENCOUNTER — Encounter: Payer: Self-pay | Admitting: Student
